# Patient Record
Sex: Female | Born: 1975 | Hispanic: Yes | Marital: Married | Smoking: Never smoker
Health system: Southern US, Community
[De-identification: ages and names within clinical notes are randomized; demographics above are authoritative.]

## PROBLEM LIST (undated history)

## (undated) DIAGNOSIS — D229 Melanocytic nevi, unspecified: Secondary | ICD-10-CM

## (undated) HISTORY — DX: Melanocytic nevi, unspecified: D22.9

---

## 2002-07-07 ENCOUNTER — Inpatient Hospital Stay (HOSPITAL_COMMUNITY): Admission: AD | Admit: 2002-07-07 | Discharge: 2002-07-07 | Payer: Self-pay | Admitting: Family Medicine

## 2002-07-17 ENCOUNTER — Inpatient Hospital Stay (HOSPITAL_COMMUNITY): Admission: AD | Admit: 2002-07-17 | Discharge: 2002-07-17 | Payer: Self-pay | Admitting: *Deleted

## 2002-09-23 ENCOUNTER — Ambulatory Visit (HOSPITAL_COMMUNITY): Admission: RE | Admit: 2002-09-23 | Discharge: 2002-09-23 | Payer: Self-pay | Admitting: *Deleted

## 2003-02-15 ENCOUNTER — Inpatient Hospital Stay (HOSPITAL_COMMUNITY): Admission: AD | Admit: 2003-02-15 | Discharge: 2003-02-19 | Payer: Self-pay | Admitting: *Deleted

## 2003-02-24 ENCOUNTER — Inpatient Hospital Stay (HOSPITAL_COMMUNITY): Admission: AD | Admit: 2003-02-24 | Discharge: 2003-02-24 | Payer: Self-pay | Admitting: Obstetrics & Gynecology

## 2003-03-04 ENCOUNTER — Encounter: Admission: RE | Admit: 2003-03-04 | Discharge: 2003-03-04 | Payer: Self-pay | Admitting: Family Medicine

## 2003-03-18 ENCOUNTER — Encounter: Admission: RE | Admit: 2003-03-18 | Discharge: 2003-03-18 | Payer: Self-pay | Admitting: Family Medicine

## 2003-06-23 ENCOUNTER — Emergency Department (HOSPITAL_COMMUNITY): Admission: AD | Admit: 2003-06-23 | Discharge: 2003-06-23 | Payer: Self-pay | Admitting: Family Medicine

## 2005-08-19 ENCOUNTER — Ambulatory Visit (HOSPITAL_COMMUNITY): Admission: RE | Admit: 2005-08-19 | Discharge: 2005-08-19 | Payer: Self-pay | Admitting: Family Medicine

## 2005-08-20 ENCOUNTER — Ambulatory Visit (HOSPITAL_COMMUNITY): Admission: RE | Admit: 2005-08-20 | Discharge: 2005-08-20 | Payer: Self-pay | Admitting: *Deleted

## 2005-08-20 ENCOUNTER — Ambulatory Visit: Payer: Self-pay | Admitting: *Deleted

## 2005-10-28 ENCOUNTER — Inpatient Hospital Stay (HOSPITAL_COMMUNITY): Admission: AD | Admit: 2005-10-28 | Discharge: 2005-10-28 | Payer: Self-pay | Admitting: Obstetrics

## 2005-10-28 ENCOUNTER — Ambulatory Visit: Payer: Self-pay | Admitting: Obstetrics and Gynecology

## 2005-11-05 ENCOUNTER — Ambulatory Visit (HOSPITAL_COMMUNITY): Admission: RE | Admit: 2005-11-05 | Discharge: 2005-11-05 | Payer: Self-pay | Admitting: *Deleted

## 2006-01-10 ENCOUNTER — Ambulatory Visit: Payer: Self-pay | Admitting: *Deleted

## 2006-01-10 ENCOUNTER — Inpatient Hospital Stay (HOSPITAL_COMMUNITY): Admission: AD | Admit: 2006-01-10 | Discharge: 2006-01-12 | Payer: Self-pay | Admitting: Family Medicine

## 2006-08-05 IMAGING — US US OB FOLLOW-UP
1 series · 18 of 28 positions shown · non-contrast
Comparison: none

CLINICAL DATA: 29 week 5 day assigned gestational age by prior ultrasound.  Follow-up fetal anatomy and growth.

[Series 1: us ob re-eval · 18 of 52 slices shown]
[im 1/52]
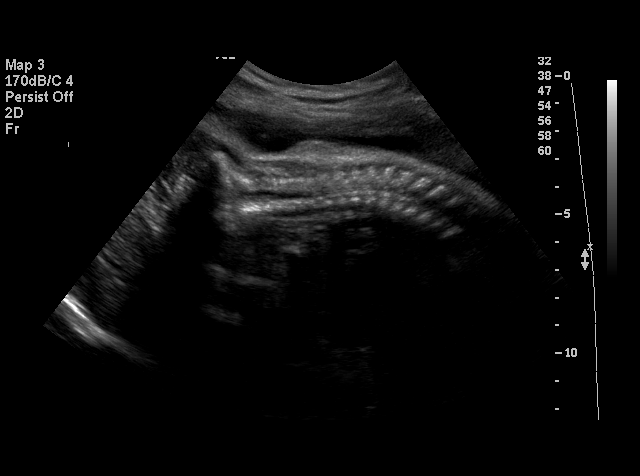
[im 4/52]
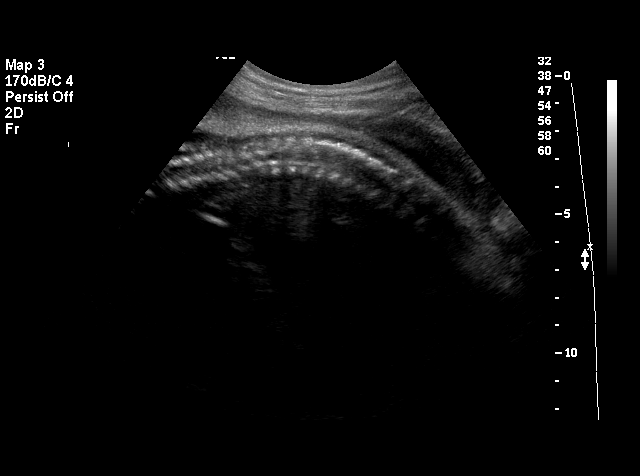
[im 6/52]
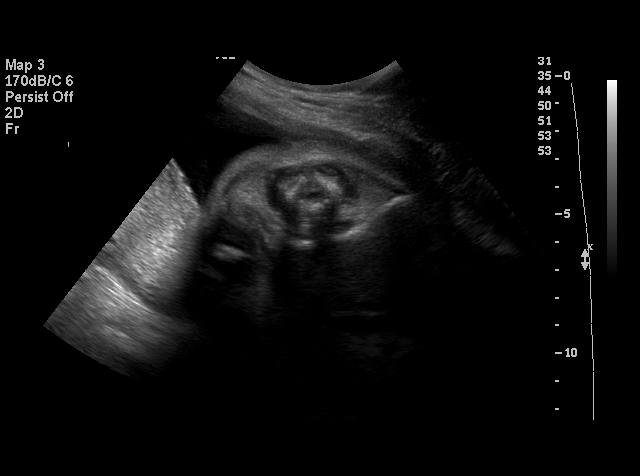
[im 10/52]
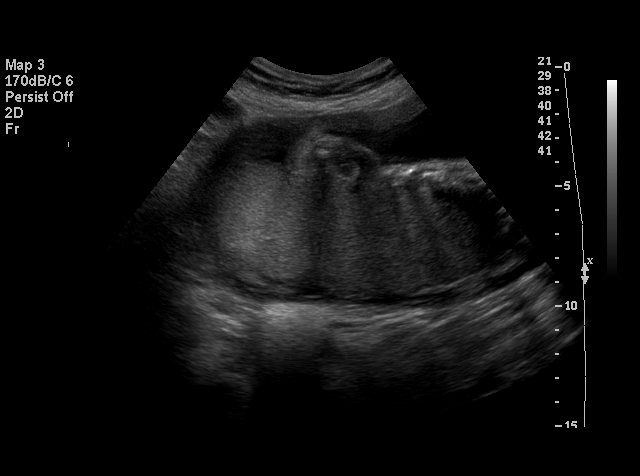
[im 14/52]
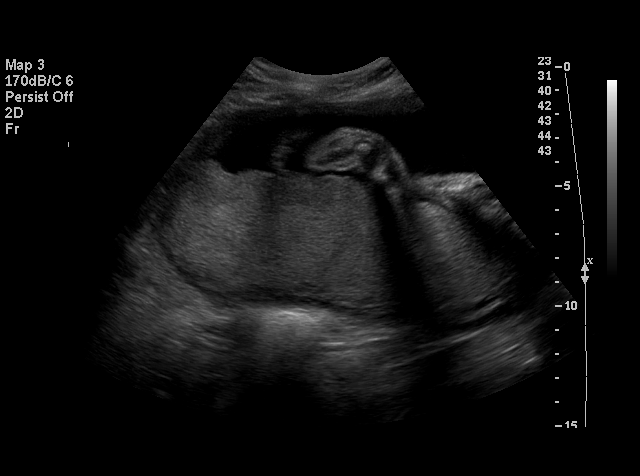
[im 16/52]
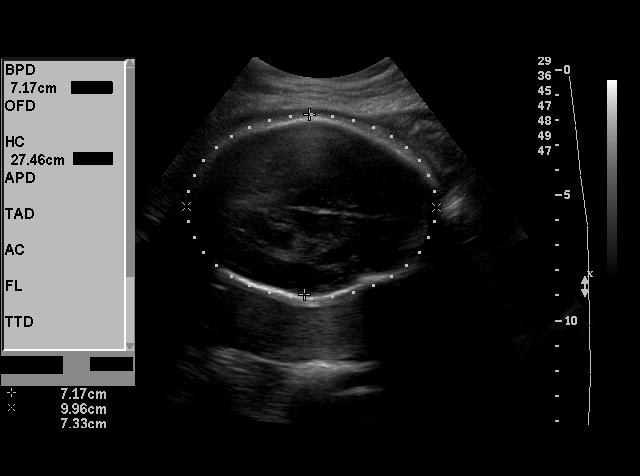
[im 19/52]
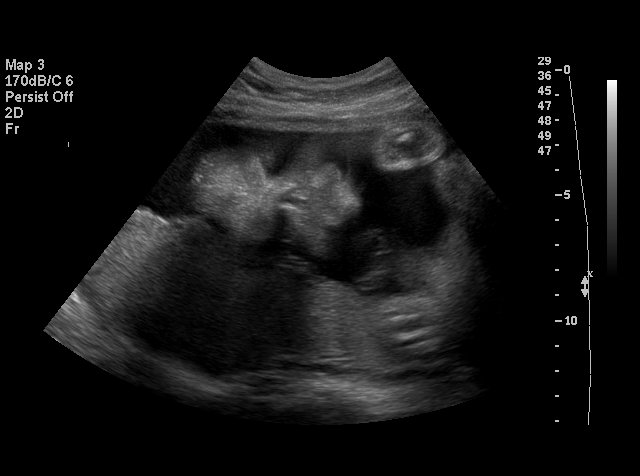
[im 21/52]
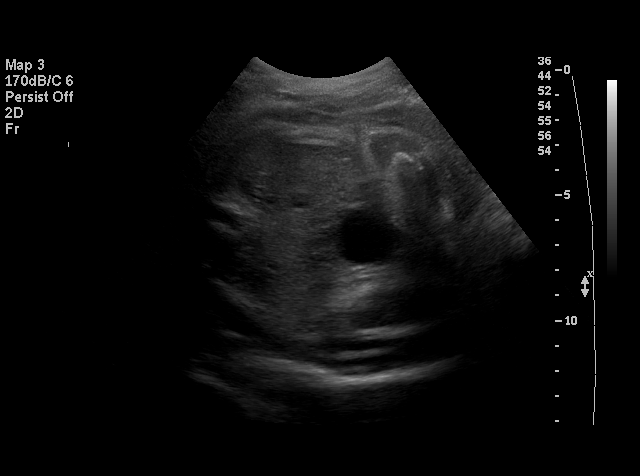
[im 25/52]
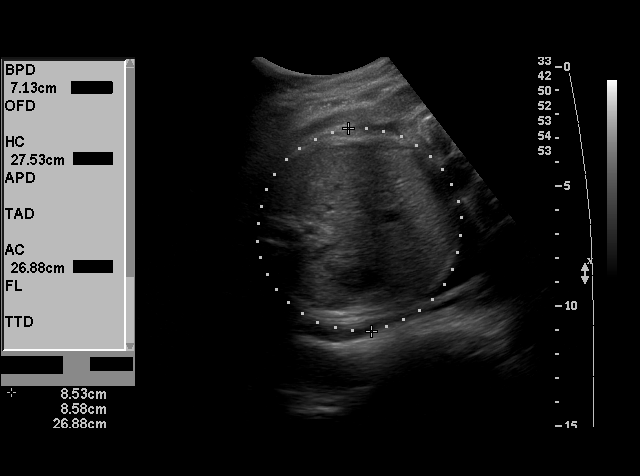
[im 27/52]
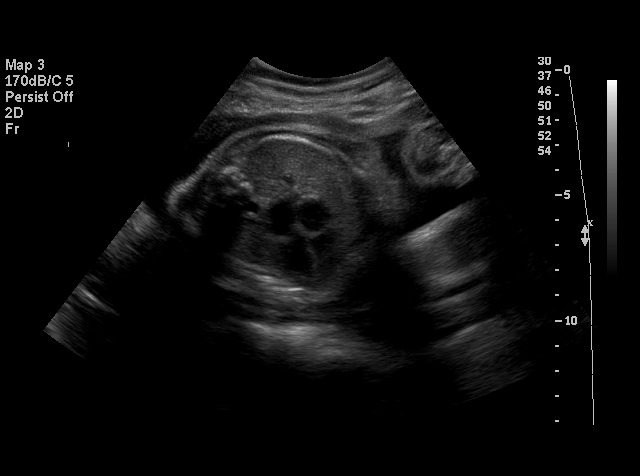
[im 31/52]
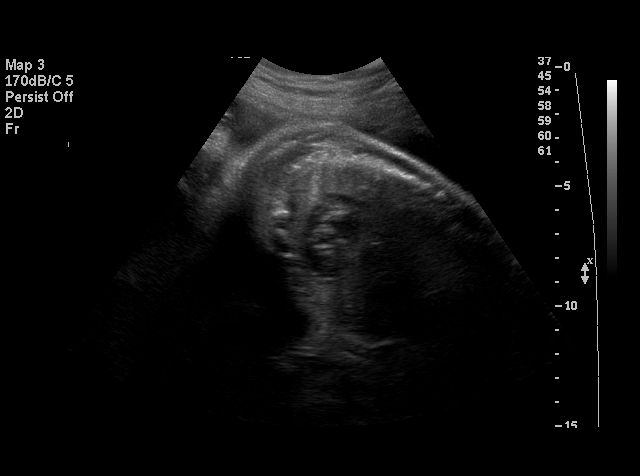
[im 33/52]
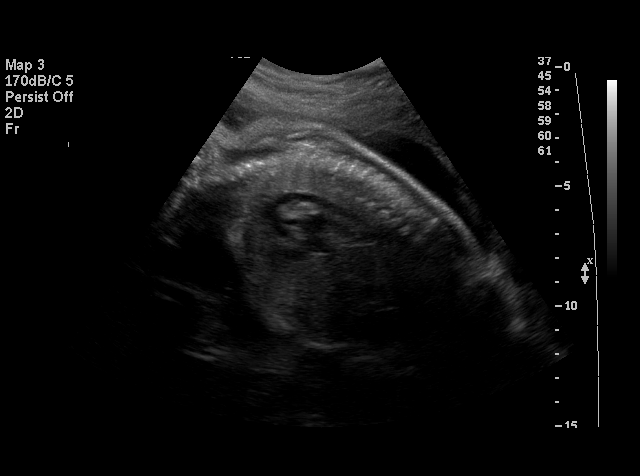
[im 36/52]
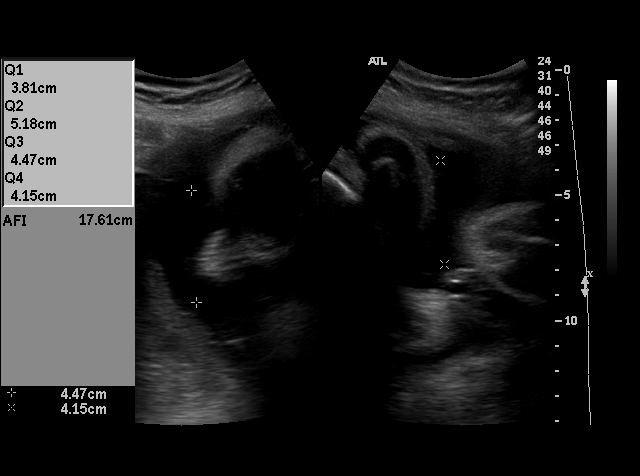
[im 40/52]
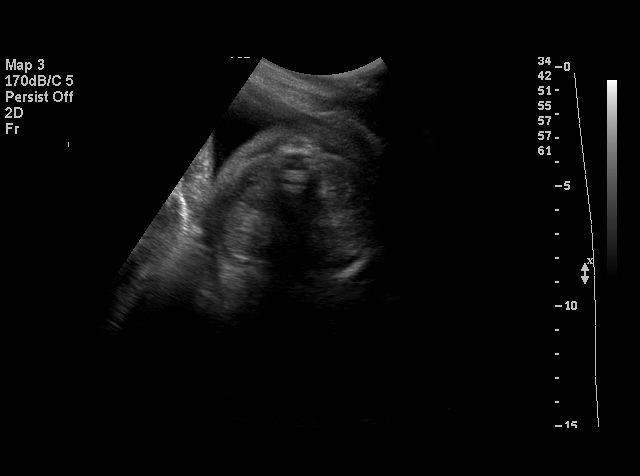
[im 42/52]
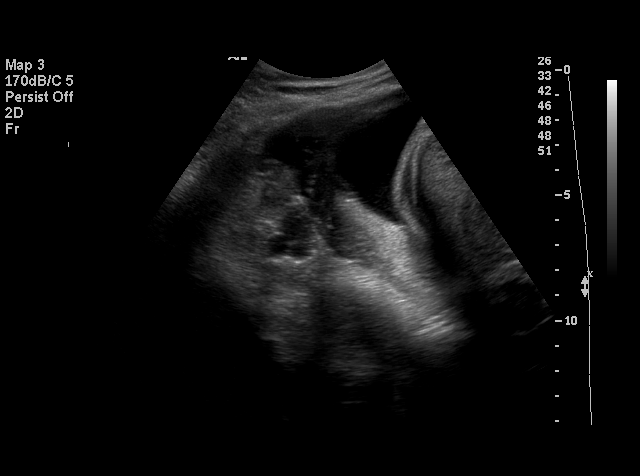
[im 46/52]
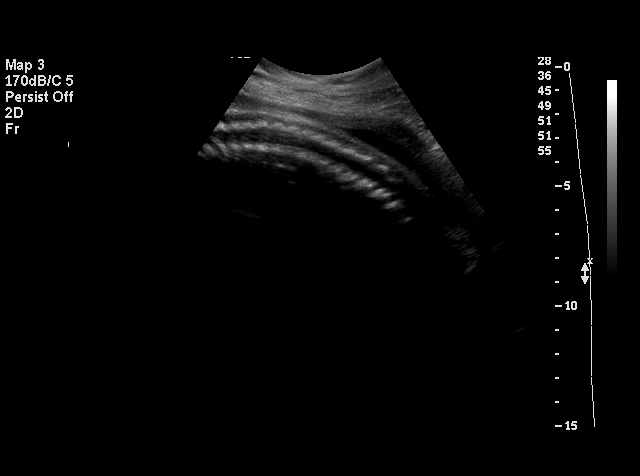
[im 48/52]
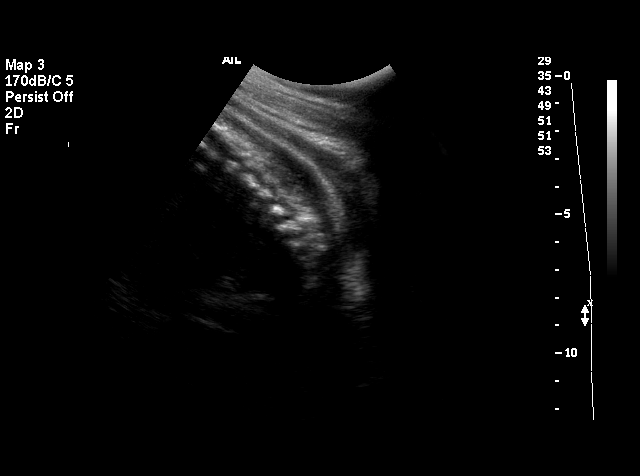
[im 52/52]
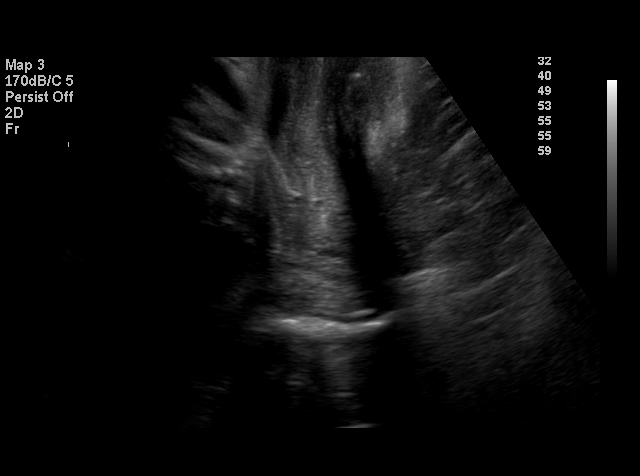

[18 of 28 positions shown; findings below may reference images not displayed]

OBSTETRICAL ULTRASOUND RE-EVALUATION:
 Number of Fetuses: 1
 Heart Rate:  136
 Movement:  Yes
 Breathing:  No
 Presentation:  Breech
 Placental Location:  Posterior
 Grade:  I
 Previa:  No
 Amniotic Fluid (subjective):  Normal
 Amniotic Fluid (objective):  17.6 cm AFI (5th -95th%ile = 7.0 – 23.4 cm for 30 wks)

 FETAL BIOMETRY
 BPD:  7.1 cm   28 w 5 d
 HC:  27.3 cm   29 w 6 d
 AC:  27.3 cm   31 w 3 d
 FL:  5.2 cm   27 w 6 d

 Mean GA:  29 w 4 d  US EDC:  01/17/06
 Assigned GA:  29 w 5 d  Assigned EDC:  01/16/06

 EFW:  6242 g (H) 50th – 75th%ile (9998 – 0300 g) For 30 wks

 FETAL ANATOMY
 Lateral Ventricles:  Visualized 
 Thalami/CSP:  Previously seen 
 Posterior Fossa:  Previously seen 
 Nuchal Region:  N/A
 Spine:  Limited, distal spine not visualized due to position
 4 Chamber Heart on Left:  Visualized 
 Stomach on Left:  Visualized 
 3 Vessel Cord:  Previously seen 
 Cord Insertion Site:  Previously seen 
 Kidneys:  Visualized 
 Bladder:  Visualized 
 Extremities:  Previously seen 
 Evaluation limited by:  Advanced gestational age and fetal position.

 MATERNAL UTERINE AND ADNEXAL FINDINGS
 Cervix:  3.0 cm Translabially
IMPRESSION: 1.  Single living intrauterine fetus with assigned gestational age of 29 weeks 5 days.  Appropriate fetal growth, with EFW at 50th – 75th percentile.  
 2.  Distal spine was not well visualized due to position, however no fetal abnormality identified.

## 2012-03-31 ENCOUNTER — Other Ambulatory Visit (HOSPITAL_COMMUNITY): Payer: Self-pay | Admitting: Physician Assistant

## 2012-03-31 DIAGNOSIS — O3680X0 Pregnancy with inconclusive fetal viability, not applicable or unspecified: Secondary | ICD-10-CM

## 2012-04-02 ENCOUNTER — Other Ambulatory Visit: Payer: Self-pay | Admitting: Obstetrics & Gynecology

## 2012-04-02 ENCOUNTER — Ambulatory Visit (HOSPITAL_COMMUNITY)
Admission: RE | Admit: 2012-04-02 | Discharge: 2012-04-02 | Disposition: A | Discharge status: Home / Self Care | Payer: Self-pay | Source: Ambulatory Visit | Attending: Physician Assistant | Admitting: Physician Assistant

## 2012-04-02 DIAGNOSIS — O3680X0 Pregnancy with inconclusive fetal viability, not applicable or unspecified: Secondary | ICD-10-CM

## 2012-04-02 DIAGNOSIS — O021 Missed abortion: Secondary | ICD-10-CM | POA: Insufficient documentation

## 2012-04-02 DIAGNOSIS — O368390 Maternal care for abnormalities of the fetal heart rate or rhythm, unspecified trimester, not applicable or unspecified: Secondary | ICD-10-CM | POA: Insufficient documentation

## 2012-04-02 NOTE — Progress Notes (Signed)
Patient with missed abortion measuring about [redacted]w[redacted]d, multiple anatomic anomalies noted on ultrasound concerning for chromosomal abnormality.  Eino Farber Muhammed, CNM discussed diagnosis with patient with the help of a Spanish interpreter, appropriate support given to patient. She was counseled about needing a D&E, this was scheduled on 04/03/12 at 1000 with Dr. Catalina Antigua, this will be done under ultrasound-guidance and sample of the products of conception will be sent for chromosomal analysis/cytogenetic analysis.  Routine preoperative instructions were given to the patient including having nothing to eat or drink after midnight on the day prior to surgery and also to arrive to the hospital by 0830 on 04/03/12.

## 2012-04-03 ENCOUNTER — Encounter (HOSPITAL_COMMUNITY): Payer: Self-pay | Admitting: Radiology

## 2012-04-03 ENCOUNTER — Encounter (HOSPITAL_COMMUNITY)
Admission: RE | Disposition: A | Discharge status: Home / Self Care | Payer: Self-pay | Source: Ambulatory Visit | Attending: Obstetrics and Gynecology

## 2012-04-03 ENCOUNTER — Encounter (HOSPITAL_COMMUNITY): Payer: Self-pay | Admitting: Anesthesiology

## 2012-04-03 ENCOUNTER — Ambulatory Visit (HOSPITAL_COMMUNITY): Payer: Self-pay | Admitting: Anesthesiology

## 2012-04-03 ENCOUNTER — Encounter (HOSPITAL_COMMUNITY): Payer: Self-pay | Admitting: *Deleted

## 2012-04-03 ENCOUNTER — Other Ambulatory Visit: Payer: Self-pay | Admitting: Obstetrics & Gynecology

## 2012-04-03 ENCOUNTER — Other Ambulatory Visit (HOSPITAL_COMMUNITY): Payer: Self-pay | Admitting: Obstetrics & Gynecology

## 2012-04-03 ENCOUNTER — Ambulatory Visit (HOSPITAL_COMMUNITY)
Admission: RE | Admit: 2012-04-03 | Discharge: 2012-04-03 | Disposition: A | Discharge status: Home / Self Care | Payer: MEDICAID | Source: Ambulatory Visit | Attending: Obstetrics and Gynecology | Admitting: Obstetrics and Gynecology

## 2012-04-03 ENCOUNTER — Inpatient Hospital Stay (HOSPITAL_COMMUNITY): Payer: Self-pay

## 2012-04-03 DIAGNOSIS — IMO0002 Reserved for concepts with insufficient information to code with codable children: Secondary | ICD-10-CM

## 2012-04-03 DIAGNOSIS — O021 Missed abortion: Secondary | ICD-10-CM | POA: Insufficient documentation

## 2012-04-03 HISTORY — PX: DILATION AND EVACUATION: SHX1459

## 2012-04-03 LAB — CBC
MCHC: 33.7 g/dL (ref 30.0–36.0)
Platelets: 268 10*3/uL (ref 150–400)
RDW: 13.3 % (ref 11.5–15.5)
WBC: 6.9 10*3/uL (ref 4.0–10.5)

## 2012-04-03 SURGERY — DILATION AND EVACUATION, UTERUS, SECOND TRIMESTER
Anesthesia: General | Site: Uterus | Wound class: Clean Contaminated

## 2012-04-03 MED ORDER — MIDAZOLAM HCL 5 MG/5ML IJ SOLN
INTRAMUSCULAR | Status: DC | PRN
  Administered 2012-04-03: 2 mg via INTRAVENOUS

## 2012-04-03 MED ORDER — PROPOFOL 10 MG/ML IV EMUL
INTRAVENOUS | Status: AC
  Filled 2012-04-03: qty 20

## 2012-04-03 MED ORDER — NEOSTIGMINE METHYLSULFATE 1 MG/ML IJ SOLN
INTRAMUSCULAR | Status: DC | PRN
  Administered 2012-04-03: 2 mg via INTRAVENOUS

## 2012-04-03 MED ORDER — MIDAZOLAM HCL 2 MG/2ML IJ SOLN
INTRAMUSCULAR | Status: AC
  Filled 2012-04-03: qty 2

## 2012-04-03 MED ORDER — LACTATED RINGERS IV SOLN: INTRAVENOUS | Status: DC

## 2012-04-03 MED ORDER — FENTANYL CITRATE 0.05 MG/ML IJ SOLN
INTRAMUSCULAR | Status: DC | PRN
  Administered 2012-04-03: 150 ug via INTRAVENOUS
  Administered 2012-04-03: 100 ug via INTRAVENOUS

## 2012-04-03 MED ORDER — ROCURONIUM BROMIDE 50 MG/5ML IV SOLN
INTRAVENOUS | Status: AC
  Filled 2012-04-03: qty 1

## 2012-04-03 MED ORDER — METHYLERGONOVINE MALEATE 0.2 MG PO TABS: 0.2000 mg | ORAL_TABLET | Freq: Four times a day (QID) | ORAL | Status: DC

## 2012-04-03 MED ORDER — ONDANSETRON HCL 4 MG/2ML IJ SOLN
INTRAMUSCULAR | Status: AC
  Filled 2012-04-03: qty 2

## 2012-04-03 MED ORDER — ROCURONIUM BROMIDE 100 MG/10ML IV SOLN
INTRAVENOUS | Status: DC | PRN
  Administered 2012-04-03: 25 mg via INTRAVENOUS

## 2012-04-03 MED ORDER — OXYCODONE-ACETAMINOPHEN 5-325 MG PO TABS: 1.0000 | ORAL_TABLET | ORAL | Status: AC | PRN

## 2012-04-03 MED ORDER — LIDOCAINE HCL 1 % IJ SOLN
INTRAMUSCULAR | Status: DC | PRN
  Administered 2012-04-03: 10 mL

## 2012-04-03 MED ORDER — LACTATED RINGERS IV SOLN
INTRAVENOUS | Status: DC
  Administered 2012-04-03 (×2): via INTRAVENOUS

## 2012-04-03 MED ORDER — DEXAMETHASONE SODIUM PHOSPHATE 10 MG/ML IJ SOLN
INTRAMUSCULAR | Status: AC
  Filled 2012-04-03: qty 1

## 2012-04-03 MED ORDER — ONDANSETRON HCL 4 MG/2ML IJ SOLN
INTRAMUSCULAR | Status: DC | PRN
  Administered 2012-04-03: 4 mg via INTRAVENOUS

## 2012-04-03 MED ORDER — KETOROLAC TROMETHAMINE 30 MG/ML IJ SOLN
INTRAMUSCULAR | Status: DC | PRN
  Administered 2012-04-03: 30 mg via INTRAVENOUS

## 2012-04-03 MED ORDER — FENTANYL CITRATE 0.05 MG/ML IJ SOLN
INTRAMUSCULAR | Status: AC
  Filled 2012-04-03: qty 5

## 2012-04-03 MED ORDER — NEOSTIGMINE METHYLSULFATE 1 MG/ML IJ SOLN
INTRAMUSCULAR | Status: AC
  Filled 2012-04-03: qty 10

## 2012-04-03 MED ORDER — LIDOCAINE HCL (CARDIAC) 20 MG/ML IV SOLN
INTRAVENOUS | Status: AC
  Filled 2012-04-03: qty 5

## 2012-04-03 MED ORDER — PROPOFOL 10 MG/ML IV EMUL
INTRAVENOUS | Status: DC | PRN
  Administered 2012-04-03: 130 mg via INTRAVENOUS
  Administered 2012-04-03: 20 mg via INTRAVENOUS

## 2012-04-03 MED ORDER — DOXYCYCLINE HYCLATE 100 MG PO TABS: 100.0000 mg | ORAL_TABLET | Freq: Two times a day (BID) | ORAL | Status: AC

## 2012-04-03 MED ORDER — DEXAMETHASONE SODIUM PHOSPHATE 10 MG/ML IJ SOLN
INTRAMUSCULAR | Status: DC | PRN
  Administered 2012-04-03: 10 mg via INTRAVENOUS

## 2012-04-03 MED ORDER — GLYCOPYRROLATE 0.2 MG/ML IJ SOLN
INTRAMUSCULAR | Status: AC
  Filled 2012-04-03: qty 2

## 2012-04-03 MED ORDER — GLYCOPYRROLATE 0.2 MG/ML IJ SOLN
INTRAMUSCULAR | Status: DC | PRN
  Administered 2012-04-03: 0.4 mg via INTRAVENOUS

## 2012-04-03 MED ORDER — KETOROLAC TROMETHAMINE 30 MG/ML IJ SOLN: 15.0000 mg | Freq: Once | INTRAMUSCULAR | Status: DC | PRN

## 2012-04-03 MED ORDER — LIDOCAINE HCL (CARDIAC) 20 MG/ML IV SOLN
INTRAVENOUS | Status: DC | PRN
  Administered 2012-04-03: 40 mg via INTRAVENOUS

## 2012-04-03 MED ORDER — MEPERIDINE HCL 25 MG/ML IJ SOLN: 6.2500 mg | INTRAMUSCULAR | Status: DC | PRN

## 2012-04-03 MED ORDER — FENTANYL CITRATE 0.05 MG/ML IJ SOLN: 25.0000 ug | INTRAMUSCULAR | Status: DC | PRN

## 2012-04-03 MED ORDER — DOXYCYCLINE HYCLATE 100 MG IV SOLR
200.0000 mg | INTRAVENOUS | Status: AC
  Administered 2012-04-03: 200 mg via INTRAVENOUS
  Filled 2012-04-03: qty 200

## 2012-04-03 MED ORDER — ONDANSETRON HCL 4 MG/2ML IJ SOLN: 4.0000 mg | Freq: Once | INTRAMUSCULAR | Status: DC | PRN

## 2012-04-03 SURGICAL SUPPLY — 16 items
CLOTH BEACON ORANGE TIMEOUT ST (SAFETY) ×2 IMPLANT
CONT PATH 16OZ SNAP LID 3702 (MISCELLANEOUS) ×1 IMPLANT
DRAPE HYSTEROSCOPY (DRAPE) ×2 IMPLANT
GLOVE BIO SURGEON STRL SZ 6.5 (GLOVE) ×4 IMPLANT
GOWN PREVENTION PLUS LG XLONG (DISPOSABLE) ×4 IMPLANT
KIT BERKELEY 2ND TRIMESTER 1/2 (COLLECTOR) ×2 IMPLANT
NDL SPNL 22GX3.5 QUINCKE BK (NEEDLE) ×1 IMPLANT
NEEDLE SPNL 22GX3.5 QUINCKE BK (NEEDLE) ×2 IMPLANT
NS IRRIG 1000ML POUR BTL (IV SOLUTION) ×2 IMPLANT
PACK VAGINAL MINOR WOMEN LF (CUSTOM PROCEDURE TRAY) ×2 IMPLANT
PAD PREP 24X48 CUFFED NSTRL (MISCELLANEOUS) ×2 IMPLANT
SYR CONTROL 10ML LL (SYRINGE) ×2 IMPLANT
TOWEL OR 17X24 6PK STRL BLUE (TOWEL DISPOSABLE) ×4 IMPLANT
TUBE VACURETTE 2ND TRIMESTER (CANNULA) ×2 IMPLANT
VACURETTE 14MM CVD 1/2 BASE (CANNULA) ×1 IMPLANT
VACURETTE 16MM ASPIR CVD .5 (CANNULA) ×1 IMPLANT

## 2012-04-03 NOTE — Op Note (Signed)
Erin Dickerson PROCEDURE DATE: 04/03/2012  PREOPERATIVE DIAGNOSIS: 14 week missed abortion. POSTOPERATIVE DIAGNOSIS: The same. PROCEDURE:     Dilation and Evacuation. SURGEON:  Dr. Catalina Antigua ASSISTANT:  Dr. Candelaria Celeste  INDICATIONS: 36 y.o. G1P0with MAB at [redacted] weeks gestation, needing surgical completion.  Risks of surgery were discussed with the patient including but not limited to: bleeding which may require transfusion; infection which may require antibiotics; injury to uterus or surrounding organs;need for additional procedures including laparotomy or laparoscopy; possibility of intrauterine scarring which may impair future fertility; and other postoperative/anesthesia complications. Written informed consent was obtained.    FINDINGS:  A 14 size midline uterus, moderate amounts of products of conception, specimen sent to pathology.  ANESTHESIA:    Monitored intravenous sedation, paracervical block. INTRAVENOUS FLUIDS:  1000 ml of LR ESTIMATED BLOOD LOSS:  300 ml. SPECIMENS:  Products of conception sent to pathology COMPLICATIONS:  None immediate.  PROCEDURE DETAILS:  The patient received intravenous antibiotics while in the preoperative area.  She was then taken to the operating room where general anesthesia was administered and was found to be adequate.  After an adequate timeout was performed, she was placed in the dorsal lithotomy position and examined; then prepped and draped in the sterile manner.   Her bladder was catheterized for an unmeasured amount of clear, yellow urine. A vaginal speculum was then placed in the patient's vagina and a paracervical block using 1% Lidocaine was administered.  A single tooth tenaculum was then applied to the anterior lip of the cervix. The cervix was gently dilated to a 47 [place] dilator size.  With assistance of ultrasonography, a placenta forcep was used to grasp and extract fetal tissue.  Due to multiple fetal anomalies and  discrepancy of dating, the only fetal parts that were identified were a hand and the face.  A 14 mm suction curette was then gently advanced to the uterine fundus and the suction device was then activated and curette slowly rotated to clear the uterus of products of conception.  A sharp curettage was then performed to confirm completer emptying of the uterus.  Ultrasonography confirmed an empty uterus with a thin endometrial stripe.  There was minimal bleeding noted and the tenaculum removed with good hemostasis noted.  The patient tolerated the procedure well.  The patient was taken to the recovery area in stable condition.  Levie Heritage, DO 04/03/2012 11:42 AM

## 2012-04-03 NOTE — Anesthesia Preprocedure Evaluation (Signed)
Anesthesia Evaluation  Patient identified by MRN, date of birth, ID band Patient awake    Reviewed: Allergy & Precautions, H&P , NPO status , Patient's Chart, lab work & pertinent test results  Airway Mallampati: II TM Distance: >3 FB Neck ROM: full    Dental No notable dental hx. (+) Teeth Intact   Pulmonary neg pulmonary ROS,    Pulmonary exam normal       Cardiovascular negative cardio ROS      Neuro/Psych negative neurological ROS  negative psych ROS   GI/Hepatic negative GI ROS, Neg liver ROS,   Endo/Other  negative endocrine ROS  Renal/GU negative Renal ROS  negative genitourinary   Musculoskeletal negative musculoskeletal ROS (+)   Abdominal Normal abdominal exam  (+)   Peds negative pediatric ROS (+)  Hematology negative hematology ROS (+)   Anesthesia Other Findings   Reproductive/Obstetrics (+) Pregnancy                           Anesthesia Physical Anesthesia Plan  ASA: II  Anesthesia Plan: General   Post-op Pain Management:    Induction: Intravenous  Airway Management Planned: Oral ETT  Additional Equipment:   Intra-op Plan:   Post-operative Plan: Extubation in OR  Informed Consent: I have reviewed the patients History and Physical, chart, labs and discussed the procedure including the risks, benefits and alternatives for the proposed anesthesia with the patient or authorized representative who has indicated his/her understanding and acceptance.   Dental Advisory Given  Plan Discussed with: CRNA and Surgeon  Anesthesia Plan Comments:         Anesthesia Quick Evaluation

## 2012-04-03 NOTE — H&P (Signed)
  Erin Dickerson is a 36 y.o. female.  She presents with missed abortion measuring about [redacted]w[redacted]d, multiple anatomic anomalies noted on ultrasound concerning for chromosomal abnormality. She has mild cramping.  Denies bleeding, fevers, chills, nausea.  Patient Active Problem List  Diagnosis  . Missed abortion at [redacted] weeks GA   Past Medical History: History reviewed. No pertinent past medical history.  Past Surgical History:  Past Surgical History  Procedure Date  . Cesarean section 2004    Obstetrical History:  OB History    Grav Para Term Preterm Abortions TAB SAB Ect Mult Living                  Social History:  History   Social History  . Marital Status: Married    Spouse Name: N/A    Number of Children: N/A  . Years of Education: N/A   Social History Main Topics  . Smoking status: Never Smoker   . Smokeless tobacco: None  . Alcohol Use: No  . Drug Use: No  . Sexually Active: Yes   Other Topics Concern  . None   Social History Narrative  . None    Family History: History reviewed. No pertinent family history.  Medications:  Prenatal vitamins,  Current Facility-Administered Medications  Medication Dose Route Frequency Provider Last Rate Last Dose  . doxycycline (VIBRAMYCIN) 200 mg in dextrose 5 % 250 mL IVPB  200 mg Intravenous On Call to OR Ugonna A Anyanwu, MD      . lactated ringers infusion   Intravenous Continuous Tereso Newcomer, MD 125 mL/hr at 04/03/12 0933    . lactated ringers infusion   Intravenous Continuous Velna Hatchet, MD        Allergies: No Known Allergies  Review of Systems: - Negative  Physical Exam: Blood pressure 117/69, pulse 73, temperature 98.4 F (36.9 C), temperature source Oral, resp. rate 16, height 5\' 1"  (1.549 m), weight 68.493 kg (151 lb), SpO2 100.00%. GENERAL: Well-developed, well-nourished female in no acute distress.  LUNGS: Clear to auscultation bilaterally.  HEART: Regular rate and rhythm. ABDOMEN:  Soft, nontender, nondistended. EXTREMITIES: Nontender, no edema, 2+ distal pulses.    Pertinent Labs/Studies:  None  Assessment : Erin Dickerson is a 36 y.o. here for D&E for missed abortion.    Plan: Risks including bleeding, perforation, damage to internal organs, damage to uterus discussed with patient.  Will proceed with D&E.  NPO since last night.  Doxycycline being administered preoperatively.  Erin Dickerson 04/03/2012, 10:13 AM

## 2012-04-03 NOTE — Anesthesia Postprocedure Evaluation (Signed)
Anesthesia Post Note  Patient: Erin Dickerson  Procedure(s) Performed: Procedure(s) (LRB): DILATATION AND EVACUATION (D&E) 2ND TRIMESTER (N/A)  Anesthesia type: General  Patient location: PACU  Post pain: Pain level controlled  Post assessment: Post-op Vital signs reviewed  Last Vitals:  Filed Vitals:   04/03/12 1200  BP: 103/68  Pulse: 68  Temp:   Resp: 16    Post vital signs: Reviewed  Level of consciousness: sedated  Complications: No apparent anesthesia complicationsfj

## 2012-04-03 NOTE — Transfer of Care (Signed)
Immediate Anesthesia Transfer of Care Note  Patient: Erin Dickerson  Procedure(s) Performed: Procedure(s) (LRB): DILATATION AND EVACUATION (D&E) 2ND TRIMESTER (N/A)  Patient Location: PACU  Anesthesia Type: General  Level of Consciousness: awake  Airway & Oxygen Therapy: Patient Spontanous Breathing and Patient connected to nasal cannula oxygen  Post-op Assessment: Report given to PACU RN and Post -op Vital signs reviewed and stable  Post vital signs: stable  Complications: No apparent anesthesia complications

## 2012-04-06 ENCOUNTER — Telehealth: Payer: Self-pay | Admitting: *Deleted

## 2012-04-06 ENCOUNTER — Encounter: Payer: Self-pay | Admitting: *Deleted

## 2012-04-06 ENCOUNTER — Encounter (HOSPITAL_COMMUNITY): Payer: Self-pay | Admitting: Obstetrics and Gynecology

## 2012-04-06 DIAGNOSIS — O021 Missed abortion: Secondary | ICD-10-CM

## 2012-04-06 NOTE — Telephone Encounter (Signed)
Erin Dickerson left a message stating that she was calling on patients behalf, she needs to talk to someone about a doctors note.

## 2012-04-06 NOTE — Telephone Encounter (Signed)
Called patient home number with Interpreter Alejandra and left a message we are retuning your call- please call clinic during office hours.

## 2012-04-07 ENCOUNTER — Encounter: Payer: Self-pay | Admitting: *Deleted

## 2012-04-07 NOTE — Telephone Encounter (Signed)
Called pt w/interpreter Erin Dickerson. Pt stated that she was needing a letter for her employer stating that she had surgery and when she could return to work.  She had come to clinic yesterday and letter was completed by Zola Button. Pt had no further questions.

## 2012-04-08 NOTE — Op Note (Signed)
Agree with above note.  Erin Dickerson 04/08/2012 3:56 PM

## 2012-05-20 ENCOUNTER — Encounter: Payer: Self-pay | Admitting: Obstetrics & Gynecology

## 2012-05-20 ENCOUNTER — Ambulatory Visit (INDEPENDENT_AMBULATORY_CARE_PROVIDER_SITE_OTHER): Payer: Self-pay | Admitting: Obstetrics & Gynecology

## 2012-05-20 VITALS — BP 109/64 | HR 65 | Temp 97.1°F | Ht <= 58 in | Wt 148.8 lb

## 2012-05-20 DIAGNOSIS — Z9889 Other specified postprocedural states: Secondary | ICD-10-CM

## 2012-05-20 DIAGNOSIS — Z09 Encounter for follow-up examination after completed treatment for conditions other than malignant neoplasm: Secondary | ICD-10-CM

## 2012-05-20 NOTE — Progress Notes (Signed)
  Subjective:    Patient ID: Erin Dickerson, female    DOB: 07-26-1976, 36 y.o.   MRN: [id number]  HPI  She is now about 6 weeks post op status post d&c for missed ab at about [redacted] weeks EGA. She denies any problems. She would like another pregnancy. She had sex at about 4 weeks post op and used condoms. She agrees to wait about 2 months before having unprotected intercourse. She had a normal period 05-07-12. She is taking PNVs daily.  Review of Systems    [facility] month for a pap smear. Objective:   Physical Exam        Assessment & Plan:   Post op doing well RTC prn

## 2012-08-17 ENCOUNTER — Encounter: Payer: Self-pay | Admitting: Obstetrics and Gynecology

## 2012-09-02 NOTE — L&D Delivery Note (Signed)
Delivery Note Pt pushed well and at t 5:54 AM a healthy female was delivered via  (Presentation:8 ; 9 ).  APGAR: 9, 9; weight pending.   Placenta status: Intact, Spontaneous.  Cord:  with the following complications: tight nuchal delivered through. Fundus contracted well with massage after delivery.  Anesthesia:  none Episiotomy: none Lacerations: none Suture Repair: n/a Est. Blood Loss (mL): 400cc  Mom to postpartum.  Baby to stay with mom. Explained circumcision to patient with translator and she declines.  She also has decided she does not want permanent sterilzation at this time. Oliver Pila 02/24/2013, 6:29 AM

## 2012-09-17 ENCOUNTER — Encounter (HOSPITAL_COMMUNITY): Payer: Self-pay | Admitting: Emergency Medicine

## 2012-09-17 ENCOUNTER — Emergency Department (HOSPITAL_COMMUNITY)
Admission: EM | Admit: 2012-09-17 | Discharge: 2012-09-17 | Disposition: A | Discharge status: Home / Self Care | Payer: Commercial Managed Care - PPO | Source: Home / Self Care | Attending: Family Medicine | Admitting: Family Medicine

## 2012-09-17 DIAGNOSIS — Z3201 Encounter for pregnancy test, result positive: Secondary | ICD-10-CM

## 2012-09-17 MED ORDER — PRENATAL MULTIVITAMIN CH: 1.0000 | ORAL_TABLET | Freq: Every day | ORAL | Status: AC

## 2012-09-17 NOTE — ED Notes (Addendum)
Pt is here for a preg test.  Did a home preg test and it was positive Sx today include: headache and dizziness Deneis: fevers, vomiting, nauseas, diarrhea LMP was 07/05/12  She is alert w/no signs of acute distress.

## 2012-09-22 NOTE — ED Provider Notes (Signed)
History     CSN: [id number]  Arrival date & time 09/17/12  1818   First MD Initiated Contact with Patient 09/17/12 1830      Chief Complaint  Patient presents with  . Possible Pregnancy    (Consider location/radiation/quality/duration/timing/severity/associated sxs/prior treatment) HPI Comments: 37 y/o female G5 P2T2P0A2L2 had a missed abortion at 14 weeks on Aug.2013 apparently with multiple malformations of the fetus. States she has used condoms inconsistently after her I&D. And her last menstrual period was the 1st week of novemnber. She is having morning nausea but no vomiting denies pelvic pain or pressure or vaginal bleeding or vaginal discharge also denies dysuria.  Taking prenatal vitamins.    History reviewed. No pertinent past medical history.  Past Surgical History  Procedure Date  . Cesarean section 2004  . Dilation and evacuation 04/03/2012    Procedure: DILATATION AND EVACUATION (D&E) 2ND TRIMESTER;  Surgeon: Catalina Antigua, MD;  Location: [facility];  Service: Gynecology;  Laterality: N/A;  with ultrasound/chromosome studies    No family history on file.  History  Substance Use Topics  . Smoking status: Never Smoker   . Smokeless tobacco: Not on file  . Alcohol Use: No    OB History    Grav Para Term Preterm Abortions TAB SAB Ect Mult Living   1               Review of Systems  Constitutional: Negative for fatigue.  Gastrointestinal: Positive for nausea. Negative for abdominal pain.  Genitourinary: Negative for dysuria, vaginal bleeding, vaginal discharge and pelvic pain.  Neurological: Negative for dizziness and headaches.  All other systems reviewed and are negative.    Allergies  Review of patient's allergies indicates no known allergies.  Home Medications   Current Outpatient Rx  Name  Route  Sig  Dispense  Refill  . PRENATAL MULTIVITAMIN CH   Oral   Take 1 tablet by mouth daily.   30 tablet   3     BP 113/78  Pulse 69  Temp 98 F (36.7  C) (Oral)  Resp 16  SpO2 100%  LMP 07/05/2012  Breastfeeding? No  Physical Exam  Nursing note and vitals reviewed. Constitutional: She is oriented to person, place, and time. She appears well-developed and well-nourished. No distress.  HENT:  Head: Normocephalic and atraumatic.  Mouth/Throat: No oropharyngeal exudate.  Eyes: Conjunctivae normal are normal. No scleral icterus.  Neck: Neck supple. No thyromegaly present.  Cardiovascular: Normal heart sounds.   Pulmonary/Chest: Breath sounds normal.  Abdominal: Soft. There is no tenderness.       Uterine fundus not palpable.  Neurological: She is alert and oriented to person, place, and time.  Skin: No rash noted. She is not diaphoretic.    ED Course  Procedures (including critical care time)  Labs Reviewed  POCT PREGNANCY, URINE - Abnormal; Notable for the following:    Preg Test, Ur POSITIVE (*)     All other components within normal limits  LAB REPORT - SCANNED   No results found.   1. Pregnancy examination or test, positive result       MDM  Continue prenatal vitamins. Supportive care discussed with patient and provided in writing. OB referal to establish prenatal care provided.         Sharin Grave, MD 09/22/12 1806

## 2012-11-23 LAB — OB RESULTS CONSOLE ABO/RH: RH Type: POSITIVE

## 2012-12-31 IMAGING — US US OB COMP +14 WK
1 series · 13 of 28 positions shown · non-contrast
Comparison: none

[Series 1: us ob comp +14 wk · 28 acquisitions, 13 frames shown]
[im 2/28]
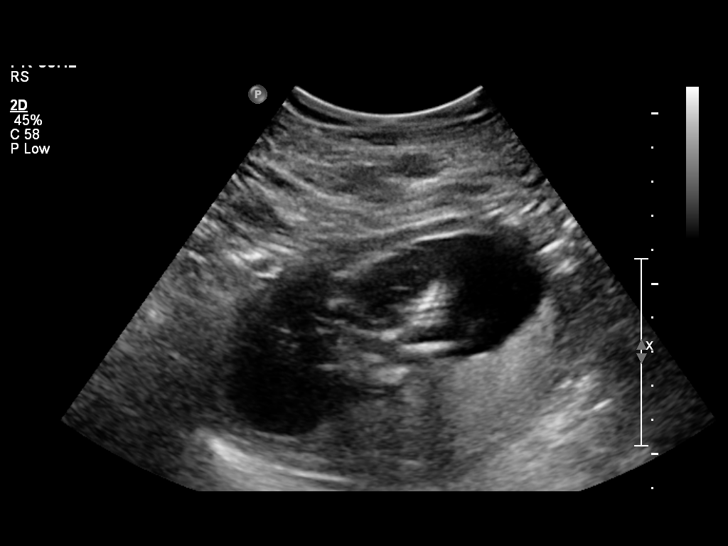
[im 4/28]
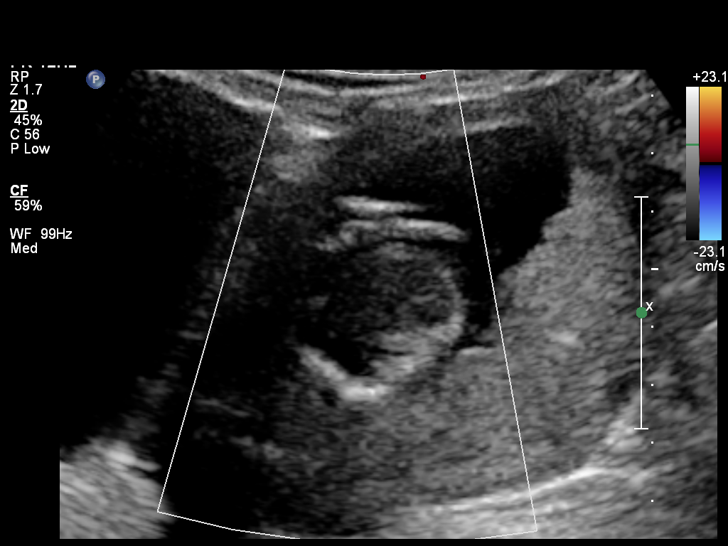
[im 6/28]
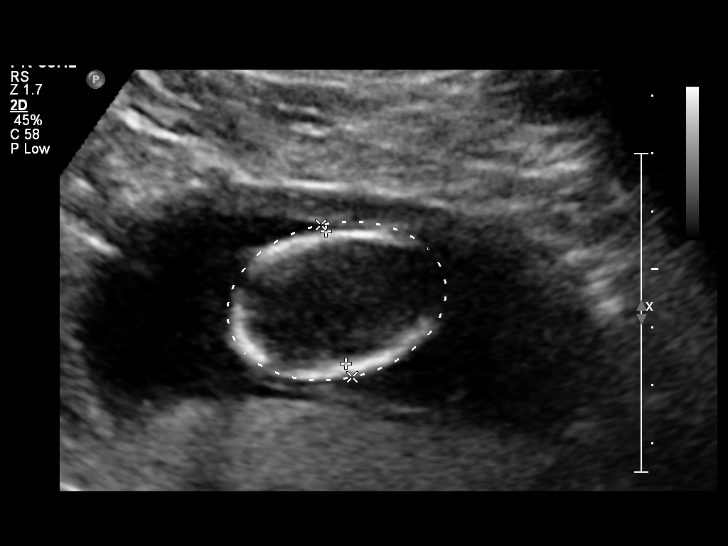
[im 8/28]
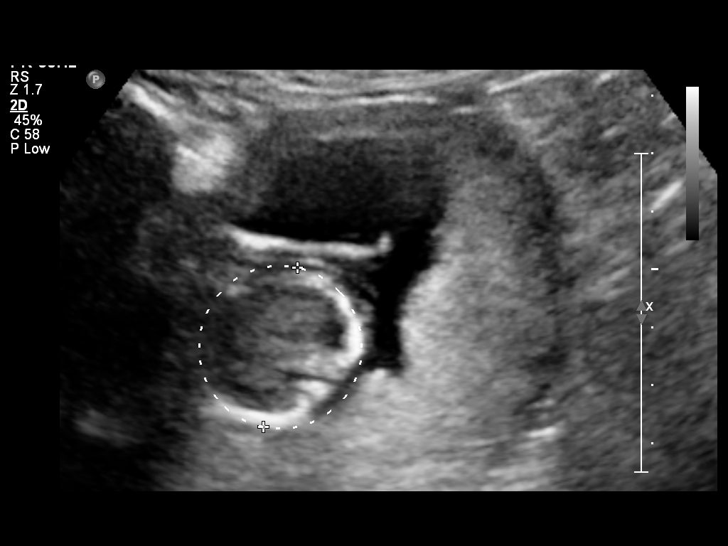
[im 10/28]
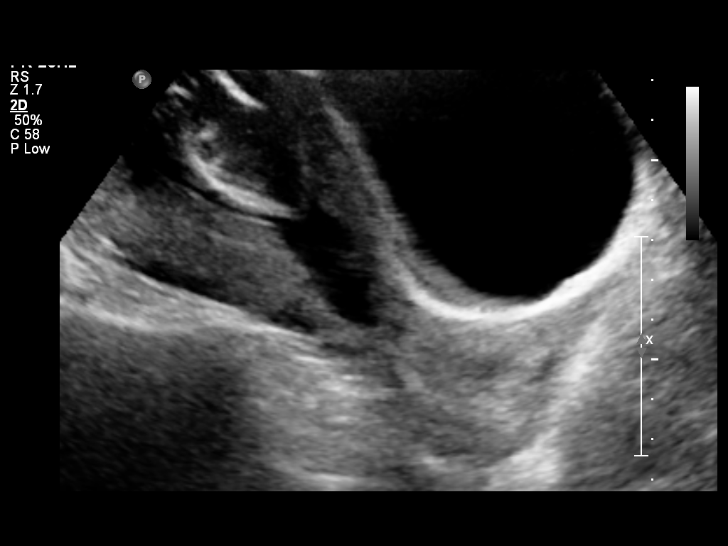
[im 12/28]
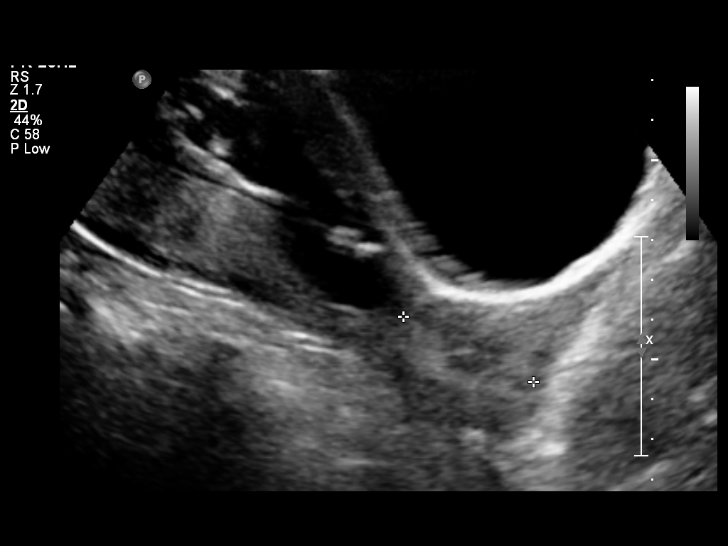
[im 15/28]
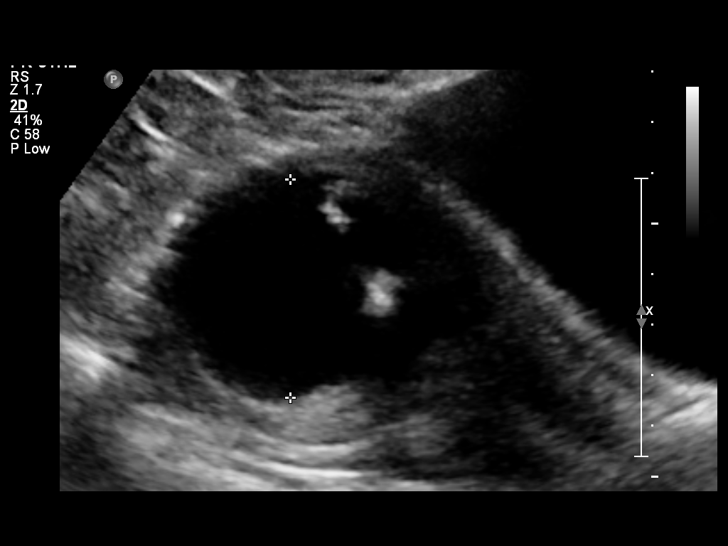
[im 17/28]
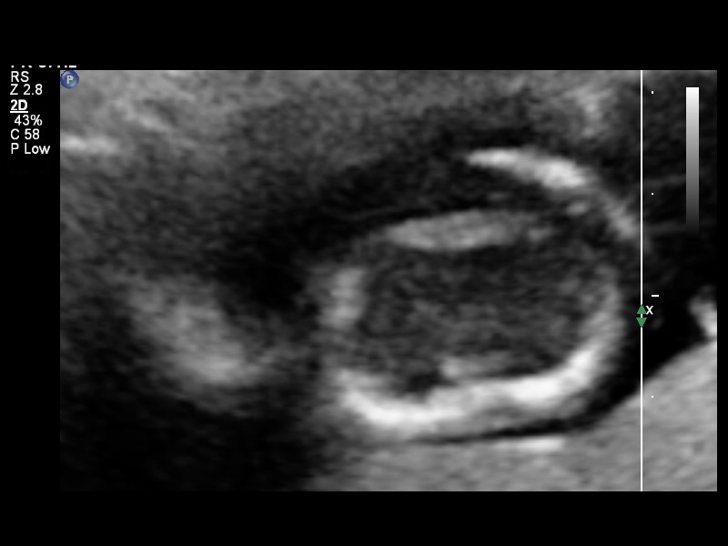
[im 19/28]
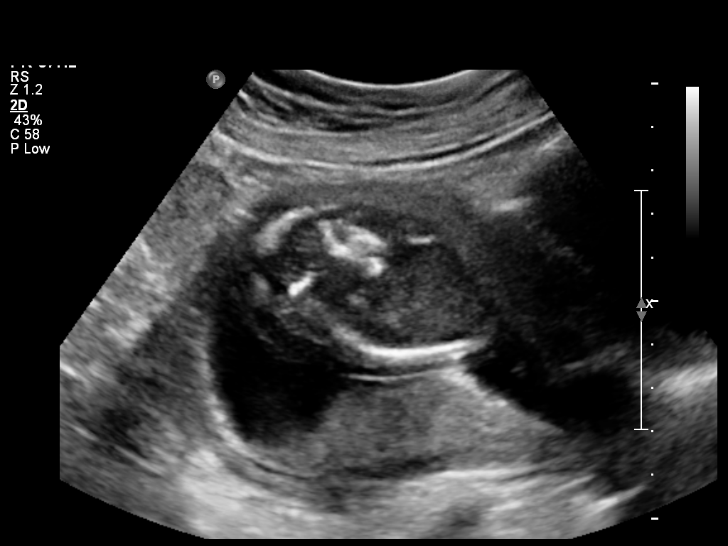
[im 21/28]
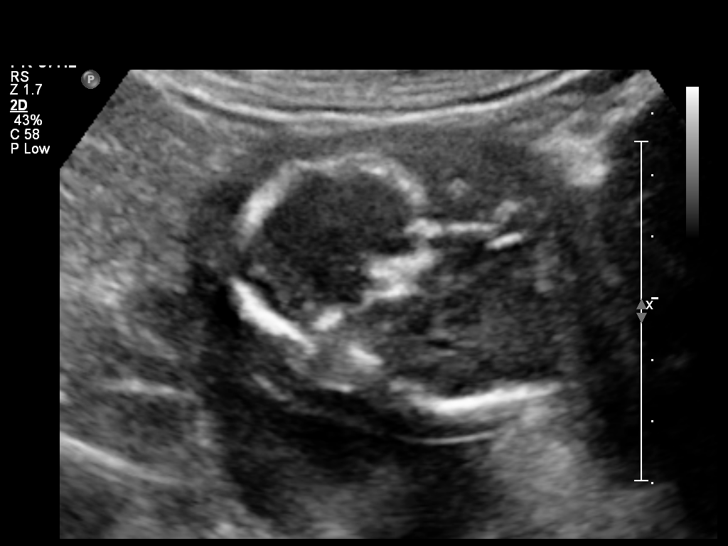
[im 23/28]
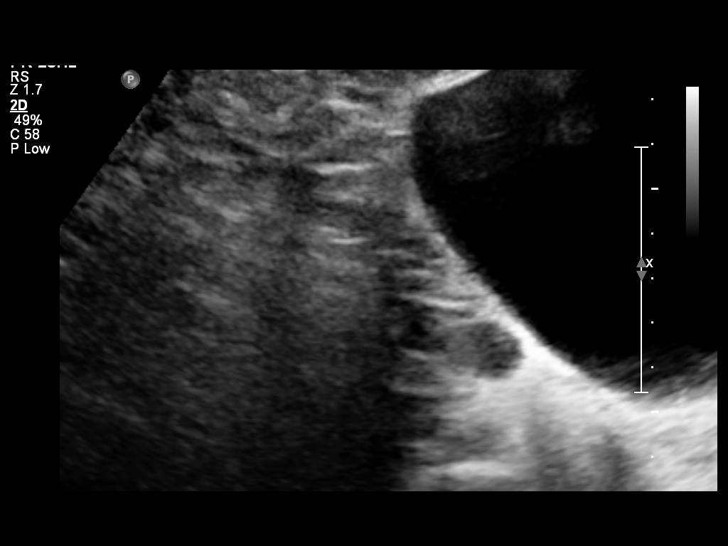
[im 25/28]
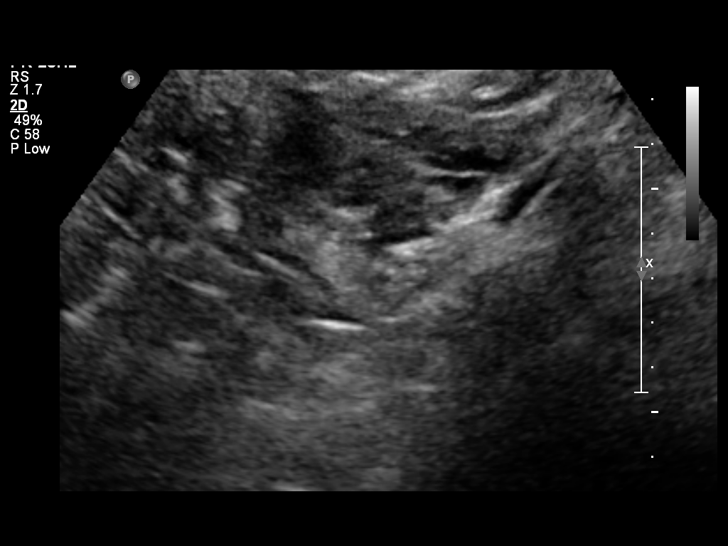
[im 27/28]
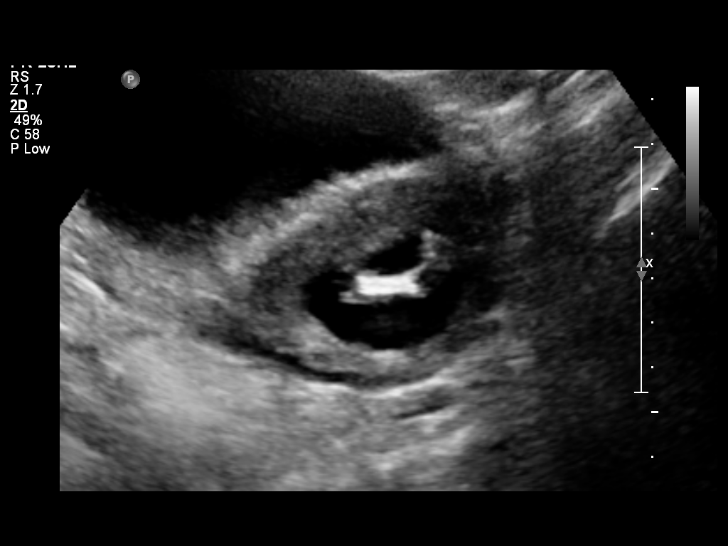

[13 of 28 positions shown; findings below may reference images not displayed]

OBSTETRICS REPORT
                      (Signed Final 04/02/2012 [DATE])

          NAZARETH

                 PA
 Order#:         [id number]
Procedures

 US OB COMP + 14 WK                                    76805.1
Indications

 No fetal cardiac activity detected
Fetal Evaluation

 Cardiac Activity:  Absent
 Presentation:      Breech
 Placenta:          Posterior, above cervical
                    os

 Amniotic Fluid
 AFI FV:      Subjectively within normal limits
Biometry

 BPD:       23  mm    G. Age:   13w 6d                CI:         53.9   70 - 86
                                                      FL/HC:      12.8   13.3 -

 HC:     103.1  mm    G. Age:   14w 6d                HC/AC:      1.17   1.05 -

 AC:      87.9  mm    G. Age:   15w 0d                FL/BPD:
 FL:      13.2  mm    G. Age:   13w 6d                FL/AC:      15.0   20 - 24

 Est. FW:      99  gm      0 lb 3 oz
Gestational Age

 LMP:           16w 1d       Date:   12/11/11                 EDD:   09/16/12
Cervix Uterus Adnexa

 Cervical Length:   3.64      cm

 Cervix:       Normal appearance by transabdominal scan.

 Adnexa:     No abnormality visualized.
Comments

 These results were discussed with Dr. Yilmer and she
 subsequently counseled the patient.
Impression

 Single intrauterine fetal demise with EGA by measurements
 today of 14w 3d. Some structural abnormalities are
 suggested with an abnormal position of the head relative to
 the thorax, questionable cystic hygroma vs. hydropic change
 and possible clubfoot deformity. Chromosomal testing of the
 fetus may be of value as today's findings are suspicious for
 underlying aneuploidy or syndrome.

 LM with us.  Please do not hesitate to

## 2013-02-24 ENCOUNTER — Encounter (HOSPITAL_COMMUNITY): Payer: Self-pay

## 2013-02-24 ENCOUNTER — Inpatient Hospital Stay (HOSPITAL_COMMUNITY)
Admission: AD | Admit: 2013-02-24 | Discharge: 2013-02-25 | DRG: 775 | Disposition: A | Discharge status: Home / Self Care | Payer: Commercial Managed Care - PPO | Source: Ambulatory Visit | Attending: Obstetrics and Gynecology | Admitting: Obstetrics and Gynecology

## 2013-02-24 DIAGNOSIS — O26619 Liver and biliary tract disorders in pregnancy, unspecified trimester: Secondary | ICD-10-CM | POA: Diagnosis present

## 2013-02-24 DIAGNOSIS — O34219 Maternal care for unspecified type scar from previous cesarean delivery: Principal | ICD-10-CM | POA: Diagnosis present

## 2013-02-24 DIAGNOSIS — O09529 Supervision of elderly multigravida, unspecified trimester: Secondary | ICD-10-CM | POA: Diagnosis present

## 2013-02-24 DIAGNOSIS — K838 Other specified diseases of biliary tract: Secondary | ICD-10-CM | POA: Diagnosis present

## 2013-02-24 LAB — RPR: RPR Ser Ql: NONREACTIVE

## 2013-02-24 LAB — CBC
HCT: 37 % (ref 36.0–46.0)
Platelets: 188 10*3/uL (ref 150–400)
RDW: 15.4 % (ref 11.5–15.5)
WBC: 9.6 10*3/uL (ref 4.0–10.5)

## 2013-02-24 MED ORDER — OXYCODONE-ACETAMINOPHEN 5-325 MG PO TABS
1.0000 | ORAL_TABLET | ORAL | Status: DC | PRN
  Administered 2013-02-24: 1 via ORAL
  Filled 2013-02-24: qty 1

## 2013-02-24 MED ORDER — DIPHENHYDRAMINE HCL 25 MG PO CAPS: 25.0000 mg | ORAL_CAPSULE | Freq: Four times a day (QID) | ORAL | Status: DC | PRN

## 2013-02-24 MED ORDER — ONDANSETRON HCL 4 MG PO TABS: 4.0000 mg | ORAL_TABLET | ORAL | Status: DC | PRN

## 2013-02-24 MED ORDER — ACETAMINOPHEN 325 MG PO TABS: 650.0000 mg | ORAL_TABLET | ORAL | Status: DC | PRN

## 2013-02-24 MED ORDER — LACTATED RINGERS IV SOLN
INTRAVENOUS | Status: DC
  Administered 2013-02-24: 06:00:00 via INTRAVENOUS

## 2013-02-24 MED ORDER — DIBUCAINE 1 % RE OINT: 1.0000 "application " | TOPICAL_OINTMENT | RECTAL | Status: DC | PRN

## 2013-02-24 MED ORDER — SENNOSIDES-DOCUSATE SODIUM 8.6-50 MG PO TABS
2.0000 | ORAL_TABLET | Freq: Every day | ORAL | Status: DC
  Administered 2013-02-24: 2 via ORAL

## 2013-02-24 MED ORDER — LIDOCAINE HCL (PF) 1 % IJ SOLN
30.0000 mL | INTRAMUSCULAR | Status: DC | PRN
  Filled 2013-02-24 (×2): qty 30

## 2013-02-24 MED ORDER — BENZOCAINE-MENTHOL 20-0.5 % EX AERO
1.0000 "application " | INHALATION_SPRAY | CUTANEOUS | Status: DC | PRN
  Administered 2013-02-24: 1 via TOPICAL
  Filled 2013-02-24: qty 56

## 2013-02-24 MED ORDER — SIMETHICONE 80 MG PO CHEW: 80.0000 mg | CHEWABLE_TABLET | ORAL | Status: DC | PRN

## 2013-02-24 MED ORDER — LACTATED RINGERS IV SOLN: 500.0000 mL | INTRAVENOUS | Status: DC | PRN

## 2013-02-24 MED ORDER — CITRIC ACID-SODIUM CITRATE 334-500 MG/5ML PO SOLN: 30.0000 mL | ORAL | Status: DC | PRN

## 2013-02-24 MED ORDER — WITCH HAZEL-GLYCERIN EX PADS: 1.0000 "application " | MEDICATED_PAD | CUTANEOUS | Status: DC | PRN

## 2013-02-24 MED ORDER — FLEET ENEMA 7-19 GM/118ML RE ENEM: 1.0000 | ENEMA | RECTAL | Status: DC | PRN

## 2013-02-24 MED ORDER — OXYTOCIN 40 UNITS IN LACTATED RINGERS INFUSION - SIMPLE MED
62.5000 mL/h | INTRAVENOUS | Status: DC
  Administered 2013-02-24: 62.5 mL/h via INTRAVENOUS
  Filled 2013-02-24: qty 1000

## 2013-02-24 MED ORDER — OXYTOCIN BOLUS FROM INFUSION: 500.0000 mL | INTRAVENOUS | Status: DC

## 2013-02-24 MED ORDER — ZOLPIDEM TARTRATE 5 MG PO TABS: 5.0000 mg | ORAL_TABLET | Freq: Every evening | ORAL | Status: DC | PRN

## 2013-02-24 MED ORDER — LANOLIN HYDROUS EX OINT: TOPICAL_OINTMENT | CUTANEOUS | Status: DC | PRN

## 2013-02-24 MED ORDER — TETANUS-DIPHTH-ACELL PERTUSSIS 5-2.5-18.5 LF-MCG/0.5 IM SUSP: 0.5000 mL | Freq: Once | INTRAMUSCULAR | Status: DC

## 2013-02-24 MED ORDER — ONDANSETRON HCL 4 MG/2ML IJ SOLN: 4.0000 mg | INTRAMUSCULAR | Status: DC | PRN

## 2013-02-24 MED ORDER — OXYCODONE-ACETAMINOPHEN 5-325 MG PO TABS: 1.0000 | ORAL_TABLET | ORAL | Status: DC | PRN

## 2013-02-24 MED ORDER — IBUPROFEN 600 MG PO TABS
600.0000 mg | ORAL_TABLET | Freq: Four times a day (QID) | ORAL | Status: DC | PRN
  Administered 2013-02-24: 600 mg via ORAL
  Filled 2013-02-24: qty 1

## 2013-02-24 MED ORDER — ONDANSETRON HCL 4 MG/2ML IJ SOLN: 4.0000 mg | Freq: Four times a day (QID) | INTRAMUSCULAR | Status: DC | PRN

## 2013-02-24 MED ORDER — PRENATAL MULTIVITAMIN CH
1.0000 | ORAL_TABLET | Freq: Every day | ORAL | Status: DC
  Administered 2013-02-24 – 2013-02-25 (×2): 1 via ORAL
  Filled 2013-02-24 (×2): qty 1

## 2013-02-24 MED ORDER — IBUPROFEN 600 MG PO TABS
600.0000 mg | ORAL_TABLET | Freq: Four times a day (QID) | ORAL | Status: DC
  Administered 2013-02-24 – 2013-02-25 (×5): 600 mg via ORAL
  Filled 2013-02-24 (×5): qty 1

## 2013-02-24 NOTE — H&P (Signed)
Erin Dickerson is a 37 y.o. female [id number] at 75 6/7 weeks (EDD 03/11/13 by LMP c/w 19 week [place]) presented to [facility] in active labor with cervical dilation at 8 cm.  Prenatal care complicated by late entry at 19 weeks and cholestasis diagnosed by elevated bile acids about a week ago.  NST's have been reactive. She had a c-section with her first pregnancy followed by successful VBAC and wished another VBAC, signing a consent.  She has decided she does not want her tubes tied.   Maternal Medical History:  Reason for admission: Contractions.   Contractions: Onset was 3-5 hours ago.   Frequency: regular.   Perceived severity is strong.    Fetal activity: Perceived fetal activity is normal.    Prenatal Complications - Diabetes: none.    OB History   Grav Para Term Preterm Abortions TAB SAB Ect Mult Living   4 2 2  1  1   2     2004 LTCS for breech 7# 2007 VBAC 6# 2013 fetal demise with multiple anomalies at 14 weeks D&C normal chromosomes  History reviewed. No pertinent past medical history. Past Surgical History  Procedure Laterality Date  . Cesarean section  2004  . Dilation and evacuation  04/03/2012    Procedure: DILATATION AND EVACUATION (D&E) 2ND TRIMESTER;  Surgeon: Catalina Antigua, MD;  Location: [facility];  Service: Gynecology;  Laterality: N/A;  with ultrasound/chromosome studies   Family History: family history is not on file. Social History:  reports that she has never smoked. She does not have any smokeless tobacco history on file. She reports that she does not drink alcohol or use illicit drugs.   Prenatal Transfer Tool  Maternal Diabetes: No Genetic Screening: Normal Maternal Ultrasounds/Referrals: Normal Fetal Ultrasounds or other Referrals:  None Maternal Substance Abuse:  No Significant Maternal Medications:  None Significant Maternal Lab Results:  None Other Comments:  None  ROS  Dilation: 10 Effacement (%): 100 Station: -2 Exam by:: Lucy Chris RNC Blood  pressure 126/66, pulse 84, temperature 98.4 F (36.9 C), temperature source Oral, resp. rate 20, height 5' (1.524 m), weight 83.008 kg (183 lb), last menstrual period 07/05/2012, not currently breastfeeding. Maternal Exam:  Uterine Assessment: Contraction strength is firm.  Contraction frequency is regular.   Abdomen: Patient reports no abdominal tenderness. Fetal presentation: vertex  Introitus: Normal vulva. Normal vagina.    Physical Exam  Constitutional: She appears well-developed and well-nourished.  Cardiovascular: Normal rate and regular rhythm.   Respiratory: Effort normal and breath sounds normal.  GI: Soft.  Genitourinary: Vagina normal and uterus normal.  Neurological: She is alert.  Psychiatric: She has a normal mood and affect.  In active labor    Prenatal labs: ABO, Rh: O/Positive/-- (03/24 0000) Antibody:   negative Rubella:  Immune RPR:   Neg HBsAg:   Neg HIV:   Neg GBS:   Neg Declined genetic screens One hour GTT 118  Assessment/Plan: Pt was admitted in active labor, and rapidly progressed to complete dilation.  I arrived to find the pt completely dilated and bearing down.  I ruptured membranes and noted light meconium stained fluid. Pt went on to deliver quickly.   Oliver Pila 02/24/2013, 6:18 AM

## 2013-02-24 NOTE — [facility]U Note (Signed)
SAYS SHE HAS BEEN HURTING BAD SINCE 0330.   VE IN OFFICE ON Monday-5 CM.  DENIES HSV AND MRSA.   WOULD NOT SIT DOWN IN TRIAGE-  USED INTERPRETER-  ANA.

## 2013-02-25 LAB — CBC
HCT: 28.3 % — ABNORMAL LOW (ref 36.0–46.0)
Hemoglobin: 9.6 g/dL — ABNORMAL LOW (ref 12.0–15.0)
MCH: 29.8 pg (ref 26.0–34.0)
MCV: 87.9 fL (ref 78.0–100.0)
Platelets: 176 10*3/uL (ref 150–400)
RBC: 3.22 MIL/uL — ABNORMAL LOW (ref 3.87–5.11)
WBC: 9.2 10*3/uL (ref 4.0–10.5)

## 2013-02-25 MED ORDER — IBUPROFEN 600 MG PO TABS: 600.0000 mg | ORAL_TABLET | Freq: Four times a day (QID) | ORAL | Status: DC

## 2013-02-25 MED ORDER — KETOROLAC TROMETHAMINE 30 MG/ML IJ SOLN
INTRAMUSCULAR | Status: AC
  Filled 2013-02-25: qty 1

## 2013-02-25 NOTE — Progress Notes (Signed)
Post Partum Day 1 Subjective: no complaints and tolerating PO Requests early d/c if baby able  Objective: Blood pressure 116/67, pulse 72, temperature 97.9 F (36.6 C), temperature source Oral, resp. rate 18, height 5' (1.524 m), weight 83.008 kg (183 lb), last menstrual period 07/05/2012, SpO2 100.00%, unknown if currently breastfeeding.  Physical Exam:  General: alert and cooperative Lochia: appropriate Uterine Fundus: firm    Recent Labs  02/24/13 0515 02/25/13 0555  HGB 12.8 9.6*  HCT 37.0 28.3*    Assessment/Plan: Discharge home if baby able   LOS: 1 day   Erin Dickerson W 02/25/2013, 9:31 AM

## 2013-02-25 NOTE — Progress Notes (Signed)
Reviewed D/C instructions with pt and FOB with Spanish Interpreter.  Pt verbalized understanding and copy of instructions given to pt.  Make appt with Dr Berenda Morale office in six weeks for follow up and Mirena placement.  Infant to remain inpatient due to bilirubin issues.  Pt verbalizes understanding of how Baby Patient functions on Mother/Baby.

## 2013-02-25 NOTE — Discharge Summary (Signed)
Obstetric Discharge Summary Reason for Admission: onset of labor Prenatal Procedures: none Intrapartum Procedures: VBAC Postpartum Procedures: none Complications-Operative and Postpartum: none Hemoglobin  Date Value Range Status  02/25/2013 9.6* 12.0 - 15.0 g/dL Final     DELTA CHECK NOTED     REPEATED TO VERIFY     HCT  Date Value Range Status  02/25/2013 28.3* 36.0 - 46.0 % Final    Physical Exam:  General: alert and cooperative Lochia: appropriate Uterine Fundus: firm   Discharge Diagnoses: Term Pregnancy-delivered  Discharge Information: Date: 02/25/2013 Activity: pelvic rest Diet: routine Medications: Ibuprofen Condition: improved Instructions: refer to practice specific booklet Discharge to: home Follow-up Information   Follow up with Oliver Pila, MD. Call in 6 weeks. (Postpartum exam and Mirena insertion)    Contact information:   [address], [address] [place] [address] [phone]       Newborn Data: Live born female  Birth Weight: 8 lb 5.3 oz (3779 g) APGAR: 9, 9  Home with mother.  Oliver Pila 02/25/2013, 9:33 AM

## 2013-02-26 ENCOUNTER — Ambulatory Visit: Payer: Self-pay

## 2013-02-26 NOTE — Lactation Note (Signed)
This note was copied from the chart of Erin Sharetta Ricchio. Lactation Consultation Note Mom states that they are following the feeding plan from [facility] yesterday. Mom states baby is latching to the breast, and dad is finger feeding formula while mom pumps. Mom and dad understand and agree with the feeding plan. Questions answered.  Enc mom to call for assistance with a feeding if she has any concerns. \  Patient Name: Erin Dickerson [id number] Date: 02/26/2013     Maternal Data    Feeding Feeding Type: Formula Feeding method: SNS (while nippling FOB's finger) Length of feed: 15 min  LATCH Score/Interventions Latch: Grasps breast easily, tongue down, lips flanged, rhythmical sucking.  Audible Swallowing: A few with stimulation  Type of Nipple: Everted at rest and after stimulation  Comfort (Breast/Nipple): Filling, red/small blisters or bruises, mild/mod discomfort  Problem noted: Mild/Moderate discomfort Interventions (Mild/moderate discomfort):  (assisted mother to get deeper latch)  Hold (Positioning): Assistance needed to correctly position infant at breast and maintain latch.  LATCH Score: 7  Lactation Tools Discussed/Used     Consult Status      Octavio Manns [facility] 02/26/2013, 11:41 AM

## 2013-02-27 ENCOUNTER — Ambulatory Visit: Payer: Self-pay

## 2013-02-27 NOTE — Lactation Note (Signed)
This note was copied from the chart of Erin Dickerson. Lactation Consultation Note  Patient Name: Erin Dickerson [id number] Date: 02/27/2013 Reason for consult: Follow-up assessment   Maternal Data    Feeding   LATCH Score/Interventions Latch: Grasps breast easily, tongue down, lips flanged, rhythmical sucking.  Audible Swallowing: A few with stimulation  Type of Nipple: Everted at rest and after stimulation  Comfort (Breast/Nipple): Soft / non-tender     Hold (Positioning): No assistance needed to correctly position infant at breast. Intervention(s): Breastfeeding basics reviewed;Support Pillows  LATCH Score: 9  Lactation Tools Discussed/Used     Consult Status Consult Status: Complete  Bili lights have been DC'd and baby to be DC'd today. Experienced BF mom. Mom reports that breasts are getting a little fuller this morning. Dad has been giving supplement- EBM or formula. Mom pumped 15 cc's this morning. Reports that she pumped 4 times yesterday because baby was too sleepy. Reports that she has appointment to sign up for [facility] on Wednesday. Plans to use manual pump at home. Reviewed frequent breast feedings to promote milk supply and decrease bilirubin levels. Dr. Manson Passey asking about finger feeding with feeding tube and syringe. Parents instructed in setup, use and cleaning of feeding tube/syringe to supplement baby by finger feeding. No questions at present. To call prn To come back here tomorrow to recheck bili levels and to see Ped on Monday.  Pamelia Hoit 02/27/2013, 10:09 AM

## 2013-09-02 NOTE — L&D Delivery Note (Signed)
Delivery Note Pt progressed quickly and pushed well.  At 9:47 AM a viable female was delivered via VBAC, Spontaneous (Presentation: ; Occiput Anterior).  APGAR: 9, 9; weight pending.   Placenta status: Intact, Spontaneous.  Cord:  with the following complications: None.  Anesthesia: None  Episiotomy: None Lacerations: None Suture Repair: none Est. Blood Loss (mL): 400  Mom to postpartum.  Baby to Couplet care / Skin to Skin.  Does not want circumcision.  Wants Ess[facility] in the office after discussion of options for BTL.  Daziya Redmond D 04/14/2014, 10:15 AM

## 2013-11-16 LAB — OB RESULTS CONSOLE RUBELLA ANTIBODY, IGM: Rubella: IMMUNE

## 2013-11-16 LAB — OB RESULTS CONSOLE HIV ANTIBODY (ROUTINE TESTING): HIV: NONREACTIVE

## 2013-11-16 LAB — OB RESULTS CONSOLE RPR: RPR: NONREACTIVE

## 2013-11-16 LAB — OB RESULTS CONSOLE ABO/RH: RH Type: POSITIVE

## 2013-11-16 LAB — OB RESULTS CONSOLE ANTIBODY SCREEN: ANTIBODY SCREEN: NEGATIVE

## 2013-11-16 LAB — OB RESULTS CONSOLE GC/CHLAMYDIA
CHLAMYDIA, DNA PROBE: NEGATIVE
Gonorrhea: NEGATIVE

## 2013-11-16 LAB — OB RESULTS CONSOLE HEPATITIS B SURFACE ANTIGEN: Hepatitis B Surface Ag: NEGATIVE

## 2014-04-07 LAB — OB RESULTS CONSOLE GBS: STREP GROUP B AG: NEGATIVE

## 2014-04-11 ENCOUNTER — Telehealth (HOSPITAL_COMMUNITY): Payer: Self-pay | Admitting: *Deleted

## 2014-04-11 ENCOUNTER — Encounter (HOSPITAL_COMMUNITY): Payer: Self-pay | Admitting: *Deleted

## 2014-04-11 NOTE — Telephone Encounter (Signed)
Preadmission screen [id number] interpreter number

## 2014-04-14 ENCOUNTER — Inpatient Hospital Stay (HOSPITAL_COMMUNITY)
Admission: RE | Admit: 2014-04-14 | Discharge: 2014-04-15 | DRG: 775 | Disposition: A | Discharge status: Home / Self Care | Payer: Commercial Managed Care - PPO | Source: Ambulatory Visit | Attending: Obstetrics and Gynecology | Admitting: Obstetrics and Gynecology

## 2014-04-14 ENCOUNTER — Encounter (HOSPITAL_COMMUNITY): Payer: Self-pay

## 2014-04-14 VITALS — BP 107/52 | HR 64 | Temp 97.8°F | Resp 20 | Ht 59.0 in | Wt 172.0 lb

## 2014-04-14 DIAGNOSIS — O26619 Liver and biliary tract disorders in pregnancy, unspecified trimester: Principal | ICD-10-CM | POA: Diagnosis present

## 2014-04-14 DIAGNOSIS — O09529 Supervision of elderly multigravida, unspecified trimester: Secondary | ICD-10-CM | POA: Diagnosis present

## 2014-04-14 DIAGNOSIS — O34219 Maternal care for unspecified type scar from previous cesarean delivery: Secondary | ICD-10-CM | POA: Diagnosis present

## 2014-04-14 DIAGNOSIS — K838 Other specified diseases of biliary tract: Secondary | ICD-10-CM | POA: Diagnosis present

## 2014-04-14 DIAGNOSIS — O26613 Liver and biliary tract disorders in pregnancy, third trimester: Secondary | ICD-10-CM

## 2014-04-14 DIAGNOSIS — K831 Obstruction of bile duct: Secondary | ICD-10-CM

## 2014-04-14 LAB — CBC
HCT: 38 % (ref 36.0–46.0)
Hemoglobin: 12.9 g/dL (ref 12.0–15.0)
MCH: 29.8 pg (ref 26.0–34.0)
MCHC: 33.9 g/dL (ref 30.0–36.0)
MCV: 87.8 fL (ref 78.0–100.0)
Platelets: 189 10*3/uL (ref 150–400)
RBC: 4.33 MIL/uL (ref 3.87–5.11)
RDW: 15.6 % — ABNORMAL HIGH (ref 11.5–15.5)
WBC: 6.6 10*3/uL (ref 4.0–10.5)

## 2014-04-14 LAB — TYPE AND SCREEN
ABO/RH(D): O POS
Antibody Screen: NEGATIVE

## 2014-04-14 LAB — RPR

## 2014-04-14 MED ORDER — OXYCODONE-ACETAMINOPHEN 5-325 MG PO TABS: 1.0000 | ORAL_TABLET | ORAL | Status: DC | PRN

## 2014-04-14 MED ORDER — LACTATED RINGERS IV SOLN: 500.0000 mL | INTRAVENOUS | Status: DC | PRN

## 2014-04-14 MED ORDER — TETANUS-DIPHTH-ACELL PERTUSSIS 5-2.5-18.5 LF-MCG/0.5 IM SUSP
0.5000 mL | Freq: Once | INTRAMUSCULAR | Status: AC
  Administered 2014-04-15: 0.5 mL via INTRAMUSCULAR

## 2014-04-14 MED ORDER — WITCH HAZEL-GLYCERIN EX PADS: 1.0000 "application " | MEDICATED_PAD | CUTANEOUS | Status: DC | PRN

## 2014-04-14 MED ORDER — ONDANSETRON HCL 4 MG/2ML IJ SOLN: 4.0000 mg | INTRAMUSCULAR | Status: DC | PRN

## 2014-04-14 MED ORDER — OXYTOCIN 40 UNITS IN LACTATED RINGERS INFUSION - SIMPLE MED
1.0000 m[IU]/min | INTRAVENOUS | Status: DC
  Administered 2014-04-14: 2 m[IU]/min via INTRAVENOUS

## 2014-04-14 MED ORDER — DIBUCAINE 1 % RE OINT: 1.0000 "application " | TOPICAL_OINTMENT | RECTAL | Status: DC | PRN

## 2014-04-14 MED ORDER — CITRIC ACID-SODIUM CITRATE 334-500 MG/5ML PO SOLN: 30.0000 mL | ORAL | Status: DC | PRN

## 2014-04-14 MED ORDER — TERBUTALINE SULFATE 1 MG/ML IJ SOLN: 0.2500 mg | Freq: Once | INTRAMUSCULAR | Status: DC | PRN

## 2014-04-14 MED ORDER — OXYTOCIN BOLUS FROM INFUSION: 500.0000 mL | INTRAVENOUS | Status: DC

## 2014-04-14 MED ORDER — LANOLIN HYDROUS EX OINT: TOPICAL_OINTMENT | CUTANEOUS | Status: DC | PRN

## 2014-04-14 MED ORDER — IBUPROFEN 600 MG PO TABS
600.0000 mg | ORAL_TABLET | Freq: Four times a day (QID) | ORAL | Status: DC | PRN
  Administered 2014-04-14: 600 mg via ORAL
  Filled 2014-04-14: qty 1

## 2014-04-14 MED ORDER — SIMETHICONE 80 MG PO CHEW: 80.0000 mg | CHEWABLE_TABLET | ORAL | Status: DC | PRN

## 2014-04-14 MED ORDER — BUTORPHANOL TARTRATE 1 MG/ML IJ SOLN: 1.0000 mg | INTRAMUSCULAR | Status: DC | PRN

## 2014-04-14 MED ORDER — ACETAMINOPHEN 325 MG PO TABS: 650.0000 mg | ORAL_TABLET | ORAL | Status: DC | PRN

## 2014-04-14 MED ORDER — ZOLPIDEM TARTRATE 5 MG PO TABS: 5.0000 mg | ORAL_TABLET | Freq: Every evening | ORAL | Status: DC | PRN

## 2014-04-14 MED ORDER — MEASLES, MUMPS & RUBELLA VAC ~~LOC~~ INJ: 0.5000 mL | INJECTION | Freq: Once | SUBCUTANEOUS | Status: DC

## 2014-04-14 MED ORDER — LACTATED RINGERS IV SOLN
INTRAVENOUS | Status: DC
  Administered 2014-04-14: 08:00:00 via INTRAVENOUS

## 2014-04-14 MED ORDER — BENZOCAINE-MENTHOL 20-0.5 % EX AERO: 1.0000 "application " | INHALATION_SPRAY | CUTANEOUS | Status: DC | PRN

## 2014-04-14 MED ORDER — IBUPROFEN 600 MG PO TABS
600.0000 mg | ORAL_TABLET | Freq: Four times a day (QID) | ORAL | Status: DC
  Administered 2014-04-14 – 2014-04-15 (×3): 600 mg via ORAL
  Filled 2014-04-14 (×3): qty 1

## 2014-04-14 MED ORDER — ONDANSETRON HCL 4 MG/2ML IJ SOLN: 4.0000 mg | Freq: Four times a day (QID) | INTRAMUSCULAR | Status: DC | PRN

## 2014-04-14 MED ORDER — MAGNESIUM HYDROXIDE 400 MG/5ML PO SUSP: 30.0000 mL | ORAL | Status: DC | PRN

## 2014-04-14 MED ORDER — LIDOCAINE HCL (PF) 1 % IJ SOLN
30.0000 mL | INTRAMUSCULAR | Status: DC | PRN
  Filled 2014-04-14: qty 30

## 2014-04-14 MED ORDER — OXYTOCIN 40 UNITS IN LACTATED RINGERS INFUSION - SIMPLE MED
62.5000 mL/h | INTRAVENOUS | Status: DC
  Filled 2014-04-14: qty 1000

## 2014-04-14 MED ORDER — SENNOSIDES-DOCUSATE SODIUM 8.6-50 MG PO TABS
2.0000 | ORAL_TABLET | ORAL | Status: DC
  Administered 2014-04-14: 2 via ORAL
  Filled 2014-04-14: qty 2

## 2014-04-14 MED ORDER — DIPHENHYDRAMINE HCL 25 MG PO CAPS: 25.0000 mg | ORAL_CAPSULE | Freq: Four times a day (QID) | ORAL | Status: DC | PRN

## 2014-04-14 MED ORDER — PRENATAL MULTIVITAMIN CH: 1.0000 | ORAL_TABLET | Freq: Every day | ORAL | Status: DC

## 2014-04-14 MED ORDER — ONDANSETRON HCL 4 MG PO TABS: 4.0000 mg | ORAL_TABLET | ORAL | Status: DC | PRN

## 2014-04-14 MED ORDER — METHYLERGONOVINE MALEATE 0.2 MG/ML IJ SOLN: 0.2000 mg | INTRAMUSCULAR | Status: DC | PRN

## 2014-04-14 MED ORDER — METHYLERGONOVINE MALEATE 0.2 MG PO TABS: 0.2000 mg | ORAL_TABLET | ORAL | Status: DC | PRN

## 2014-04-14 NOTE — H&P (Signed)
Erin Dickerson is a 38 y.o. female, G5 P3013, EGA [redacted] weeks with EDC 9-1 presenting for induction due to cholestasis of pregnancy.  Pregnancy otherwise uncomplicated except she has has had one c-section and 2 VBACs, see prenatal records for complete history. History OB History   Grav Para Term Preterm Abortions TAB SAB Ect Mult Living   5 3 3  1  1   3     LTCS for breech VBAC x 2  Past Medical History  Diagnosis Date  . Melanocytic nevus   . Medical history non-contributory    Past Surgical History  Procedure Laterality Date  . Cesarean section  2004  . Dilation and evacuation  04/03/2012    Procedure: DILATATION AND EVACUATION (D&E) 2ND TRIMESTER;  Surgeon: Mora Bellman, MD;  Location: [facility];  Service: Gynecology;  Laterality: N/A;  with ultrasound/chromosome studies   Family History: family history is not on file. Social History:  reports that she has never smoked. She does not have any smokeless tobacco history on file. She reports that she does not drink alcohol or use illicit drugs.   Prenatal Transfer Tool  Maternal Diabetes: No Genetic Screening: Declined Maternal Ultrasounds/Referrals: Normal Fetal Ultrasounds or other Referrals:  None Maternal Substance Abuse:  No Significant Maternal Medications:  None Significant Maternal Lab Results:  Lab values include: Group B Strep negative Other Comments:  None  Review of Systems  Respiratory: Negative.   Cardiovascular: Negative.    AROM-clear Dilation: 6 Effacement (%): 80 Station: 0 Exam by:: Shelly Shoultz Blood pressure 114/72, pulse 81, temperature 98.3 F (36.8 C), temperature source Oral, resp. rate 20, height 4\' 11"  (1.499 m), weight 78.019 kg (172 lb), last menstrual period 07/27/2013, unknown if currently breastfeeding. Maternal Exam:  Uterine Assessment: Contraction strength is mild.  Contraction frequency is irregular.   Abdomen: Surgical scars: low transverse.   Estimated fetal weight is 7 lbs.    Fetal presentation: vertex  Introitus: Normal vulva. Normal vagina.  Amniotic fluid character: clear.  Pelvis: adequate for delivery.   Cervix: Cervix evaluated by digital exam.     Fetal Exam Fetal Monitor Review: Mode: ultrasound.   Baseline rate: 140.  Variability: minimal (<5 bpm).   Pattern: no accelerations and no decelerations.    Fetal State Assessment: Category II - tracings are indeterminate.     Physical Exam  Constitutional: She appears well-developed and well-nourished.  Cardiovascular: Normal rate, regular rhythm and normal heart sounds.   No murmur heard. Respiratory: Effort normal and breath sounds normal. No respiratory distress. She has no wheezes.  GI: Soft.  Gravid     Prenatal labs: ABO, Rh: O/Positive/-- (03/17 0000) Antibody: Negative (03/17 0000) Rubella: Immune (03/17 0000) RPR: Nonreactive (03/17 0000)  HBsAg: Negative (03/17 0000)  HIV: Non-reactive (03/17 0000)  GBS: Negative (08/06 0000)  GCT:  119  Assessment/Plan: IUP at 37 weeks with cholestasis of pregnancy for induction, this will be VBAC #3.  On pitocin, AROM done, monitor progress and anticipate SVD.   Rumeal Cullipher D 04/14/2014, 8:53 AM

## 2014-04-15 MED ORDER — IBUPROFEN 600 MG PO TABS: 600.0000 mg | ORAL_TABLET | Freq: Four times a day (QID) | ORAL | Status: AC

## 2014-04-15 NOTE — Discharge Summary (Signed)
Obstetric Discharge Summary Reason for Admission: induction of labor Prenatal Procedures: NST Intrapartum Procedures: VBAC Postpartum Procedures: none Complications-Operative and Postpartum: none Hemoglobin  Date Value Ref Range Status  04/14/2014 12.9  12.0 - 15.0 g/dL Final     HCT  Date Value Ref Range Status  04/14/2014 38.0  36.0 - 46.0 % Final    Physical Exam:  General: alert and cooperative Lochia: appropriate Uterine Fundus: firm   Discharge Diagnoses: Term Pregnancy-delivered                                         Cholestasis of pregnancy Discharge Information: Date: 04/15/2014 Activity: pelvic rest Diet: routine Medications: Ibuprofen Condition: improved Instructions: refer to practice specific booklet Discharge to: home Follow-up Information   Follow up with MEISINGER,TODD D, MD In 6 weeks. (posstpartum exam)    Specialty:  Obstetrics and Gynecology   Contact information:   [address], [place] [address] [place] [address] [phone]       Newborn Data: Live born female  Birth Weight: 8 lb 5.5 oz (3785 g) APGAR: 9, 9  Home with mother.  Logan Bores 04/15/2014, 9:29 AM

## 2014-04-15 NOTE — Lactation Note (Signed)
This note was copied from the chart of [place]. Lactation Consultation Note Experienced BF of children for 6 months each, 2 1/2 exclusive then she had to go back to work and gave formula in bottle while working then BF when home. Her 49 month old weaned off d/t mom was pregnant and baby didn't want her breast any more. SHe speaks very good [place] and denied need of interpreter. Gave teaching information in [place]. Mom knowledgeable about hand expression, has good everted nipples. Baby sleeping at this time. Mom states feeding well, but isn't sure if she has enough milk. Explained newborn feeding needs. Mom encouraged to feed baby 8-12 times/24 hours and with feeding cues.  Educated about newborn behavior. Encouraged to call for assistance if needed and to verify proper latch.Reviewed Baby & Me book's Breastfeeding Basics. [place] brochure given w/resources, support groups and [place] services.Mom encouraged to do skin-to-skin. Patient Name: Boy Miniya Miguez [id number] Date: 04/15/2014     Maternal Data    Feeding    LATCH Score/Interventions                      Lactation Tools Discussed/Used     Consult Status      Theodoro Kalata 04/15/2014, 4:13 AM

## 2014-04-15 NOTE — Progress Notes (Signed)
Post Partum Day 1 Subjective: no complaints and tolerating PO  Desires early d/c home  Objective: Blood pressure 107/52, pulse 64, temperature 97.8 F (36.6 C), temperature source Oral, resp. rate 20, height 4\' 11"  (1.499 m), weight 78.019 kg (172 lb), last menstrual period 07/27/2013, SpO2 99.00%, unknown if currently breastfeeding.  Physical Exam:  General: alert and cooperative Lochia: appropriate Uterine Fundus: firm   Recent Labs  04/14/14 0745  HGB 12.9  HCT 38.0    Assessment/Plan: Discharge home D/w pt Essure in spanish and pp interval BTL, she wants to read a bit more about them and then Decide what she would like.   LOS: 1 day   Shawn Dannenberg W 04/15/2014, 9:25 AM

## 2014-07-04 ENCOUNTER — Encounter (HOSPITAL_COMMUNITY): Payer: Self-pay

## 2014-08-15 NOTE — Patient Instructions (Addendum)
   Your procedure is scheduled on:  Wednesday, Dec 16  Enter through the [employer] [facility] [place] at: 7 AM Pick up the phone at the desk and dial [phone] and inform us of your arrival.  Please call this number if you have any problems the morning of surgery: [phone]  Remember: Do not eat or drink after midnight: Tuesday Take these medicines the morning of surgery with a SIP OF WATER: None  Do not wear jewelry, make-up, or FINGER nail polish No metal in your hair or on your body. Do not wear lotions, powders, perfumes.  You may wear deodorant.  Do not bring valuables to the hospital. Contacts, dentures or bridgework may not be worn into surgery.  Patients discharged on the day of surgery will not be allowed to drive home.  Home with husband Rafeal cell [phone]

## 2014-08-16 ENCOUNTER — Encounter (HOSPITAL_COMMUNITY)
Admission: RE | Admit: 2014-08-16 | Discharge: 2014-08-16 | Disposition: A | Discharge status: Home / Self Care | Payer: Commercial Managed Care - PPO | Source: Ambulatory Visit | Attending: Obstetrics and Gynecology | Admitting: Obstetrics and Gynecology

## 2014-08-16 ENCOUNTER — Encounter (HOSPITAL_COMMUNITY): Payer: Self-pay

## 2014-08-16 DIAGNOSIS — E669 Obesity, unspecified: Secondary | ICD-10-CM | POA: Diagnosis not present

## 2014-08-16 DIAGNOSIS — Z6832 Body mass index (BMI) 32.0-32.9, adult: Secondary | ICD-10-CM | POA: Diagnosis not present

## 2014-08-16 DIAGNOSIS — Z302 Encounter for sterilization: Secondary | ICD-10-CM | POA: Diagnosis present

## 2014-08-16 DIAGNOSIS — K660 Peritoneal adhesions (postprocedural) (postinfection): Secondary | ICD-10-CM | POA: Diagnosis not present

## 2014-08-16 LAB — CBC
HCT: 38.3 % (ref 36.0–46.0)
Hemoglobin: 12.8 g/dL (ref 12.0–15.0)
MCH: 30.9 pg (ref 26.0–34.0)
MCHC: 33.4 g/dL (ref 30.0–36.0)
MCV: 92.5 fL (ref 78.0–100.0)
Platelets: 189 10*3/uL (ref 150–400)
RBC: 4.14 MIL/uL (ref 3.87–5.11)
RDW: 13.2 % (ref 11.5–15.5)
WBC: 6.2 10*3/uL (ref 4.0–10.5)

## 2014-08-16 NOTE — H&P (Signed)
Erin Dickerson is an 38 y.o. female. She is s/p VBAC in August, initially wanted [employer]ure in the office, but has changed her mind and wants laparoscopic tubal for permanent sterility.  Pertinent Gynecological History: Last pap: normal Date: 05/2013 OB History: G5,  P4014, c-section, then VBAC x 3   Menstrual History: No LMP recorded.    Past Medical History  Diagnosis Date  . Melanocytic nevus   . SVD (spontaneous vaginal delivery)     x 3    Past Surgical History  Procedure Laterality Date  . Cesarean section  2004  . Dilation and evacuation  04/03/2012    Procedure: DILATATION AND EVACUATION (Dickerson&E) 2ND TRIMESTER;  Surgeon: Mora Bellman, MD;  Location: [facility];  Service: Gynecology;  Laterality: N/A;  with ultrasound/chromosome studies    No family history on file.  Social History:  reports that she has never smoked. She has never used smokeless tobacco. She reports that she does not drink alcohol or use illicit drugs.  Allergies: No Known Allergies  No prescriptions prior to admission    Review of Systems  Respiratory: Negative.   Cardiovascular: Negative.   Gastrointestinal: Negative.   Genitourinary: Negative.     currently breastfeeding. Physical Exam  Constitutional: She appears well-developed and well-nourished.  Neck: Neck supple. No thyromegaly present.  Cardiovascular: Normal rate, regular rhythm and normal heart sounds.   No murmur heard. Respiratory: Effort normal and breath sounds normal. No respiratory distress. She has no wheezes.  GI: Soft. She exhibits no distension and no mass. There is no tenderness.  Transverse scar  Genitourinary: Vagina normal and uterus normal.  No adnexal mass    Results for orders placed or performed during the hospital encounter of 08/16/14 (from the past 24 hour(s))  CBC     Status: None   Collection Time: 08/16/14  9:35 AM  Result Value Ref Range   WBC 6.2 4.0 - 10.5 K/uL   RBC 4.14 3.87 - 5.11 MIL/uL   Hemoglobin 12.8 12.0 - 15.0 g/dL   HCT 38.3 36.0 - 46.0 %   MCV 92.5 78.0 - 100.0 fL   MCH 30.9 26.0 - 34.0 pg   MCHC 33.4 30.0 - 36.0 g/dL   RDW 13.2 11.5 - 15.5 %   Platelets 189 150 - 400 K/uL    No results found.  Assessment/Plan: Desires permanent sterility.  All options, surgical procedure, permanency, failure rate have all been discussed.  Will admit for laparoscopic bilateral salpingectomy.    Erin Dickerson 08/16/2014, 4:34 PM

## 2014-08-17 ENCOUNTER — Ambulatory Visit (HOSPITAL_COMMUNITY): Payer: Commercial Managed Care - PPO | Admitting: Anesthesiology

## 2014-08-17 ENCOUNTER — Encounter (HOSPITAL_COMMUNITY): Payer: Self-pay | Admitting: Anesthesiology

## 2014-08-17 ENCOUNTER — Ambulatory Visit (HOSPITAL_COMMUNITY)
Admission: RE | Admit: 2014-08-17 | Discharge: 2014-08-17 | Disposition: A | Discharge status: Home / Self Care | Payer: Commercial Managed Care - PPO | Source: Ambulatory Visit | Attending: Obstetrics and Gynecology | Admitting: Obstetrics and Gynecology

## 2014-08-17 ENCOUNTER — Encounter (HOSPITAL_COMMUNITY)
Admission: RE | Disposition: A | Discharge status: Home / Self Care | Payer: Self-pay | Source: Ambulatory Visit | Attending: Obstetrics and Gynecology

## 2014-08-17 DIAGNOSIS — K660 Peritoneal adhesions (postprocedural) (postinfection): Secondary | ICD-10-CM | POA: Insufficient documentation

## 2014-08-17 DIAGNOSIS — Z6832 Body mass index (BMI) 32.0-32.9, adult: Secondary | ICD-10-CM | POA: Insufficient documentation

## 2014-08-17 DIAGNOSIS — E669 Obesity, unspecified: Secondary | ICD-10-CM | POA: Insufficient documentation

## 2014-08-17 DIAGNOSIS — Z309 Encounter for contraceptive management, unspecified: Secondary | ICD-10-CM

## 2014-08-17 HISTORY — PX: LAPAROSCOPIC BILATERAL SALPINGECTOMY: SHX5889

## 2014-08-17 LAB — PREGNANCY, URINE: Preg Test, Ur: NEGATIVE

## 2014-08-17 SURGERY — SALPINGECTOMY, BILATERAL, LAPAROSCOPIC
Anesthesia: General | Site: Abdomen | Laterality: Bilateral

## 2014-08-17 MED ORDER — ONDANSETRON HCL 4 MG/2ML IJ SOLN
INTRAMUSCULAR | Status: AC
  Filled 2014-08-17: qty 2

## 2014-08-17 MED ORDER — OXYCODONE HCL 5 MG/5ML PO SOLN: 5.0000 mg | Freq: Once | ORAL | Status: DC | PRN

## 2014-08-17 MED ORDER — BUPIVACAINE HCL (PF) 0.25 % IJ SOLN
INTRAMUSCULAR | Status: AC
  Filled 2014-08-17: qty 30

## 2014-08-17 MED ORDER — DEXAMETHASONE SODIUM PHOSPHATE 10 MG/ML IJ SOLN
INTRAMUSCULAR | Status: DC | PRN
  Administered 2014-08-17: 4 mg via INTRAVENOUS

## 2014-08-17 MED ORDER — METOCLOPRAMIDE HCL 5 MG/ML IJ SOLN: 10.0000 mg | Freq: Once | INTRAMUSCULAR | Status: DC | PRN

## 2014-08-17 MED ORDER — GLYCOPYRROLATE 0.2 MG/ML IJ SOLN
INTRAMUSCULAR | Status: DC | PRN
  Administered 2014-08-17: 0.6 mg via INTRAVENOUS

## 2014-08-17 MED ORDER — SCOPOLAMINE 1 MG/3DAYS TD PT72
1.0000 | MEDICATED_PATCH | Freq: Once | TRANSDERMAL | Status: DC
  Administered 2014-08-17: 1.5 mg via TRANSDERMAL

## 2014-08-17 MED ORDER — LIDOCAINE HCL (CARDIAC) 20 MG/ML IV SOLN
INTRAVENOUS | Status: AC
  Filled 2014-08-17: qty 5

## 2014-08-17 MED ORDER — LACTATED RINGERS IV SOLN
INTRAVENOUS | Status: DC
  Administered 2014-08-17 (×2): via INTRAVENOUS

## 2014-08-17 MED ORDER — PROPOFOL 10 MG/ML IV BOLUS
INTRAVENOUS | Status: DC | PRN
  Administered 2014-08-17: 150 mg via INTRAVENOUS

## 2014-08-17 MED ORDER — KETOROLAC TROMETHAMINE 30 MG/ML IJ SOLN
INTRAMUSCULAR | Status: DC | PRN
  Administered 2014-08-17: 30 mg via INTRAVENOUS

## 2014-08-17 MED ORDER — MIDAZOLAM HCL 2 MG/2ML IJ SOLN
INTRAMUSCULAR | Status: AC
  Filled 2014-08-17: qty 2

## 2014-08-17 MED ORDER — FENTANYL CITRATE 0.05 MG/ML IJ SOLN
INTRAMUSCULAR | Status: DC | PRN
  Administered 2014-08-17 (×2): 50 ug via INTRAVENOUS
  Administered 2014-08-17: 25 ug via INTRAVENOUS

## 2014-08-17 MED ORDER — FENTANYL CITRATE 0.05 MG/ML IJ SOLN
INTRAMUSCULAR | Status: AC
  Filled 2014-08-17: qty 5

## 2014-08-17 MED ORDER — PROPOFOL 10 MG/ML IV EMUL
INTRAVENOUS | Status: AC
  Filled 2014-08-17: qty 20

## 2014-08-17 MED ORDER — ONDANSETRON HCL 4 MG/2ML IJ SOLN
INTRAMUSCULAR | Status: DC | PRN
  Administered 2014-08-17: 4 mg via INTRAVENOUS

## 2014-08-17 MED ORDER — MIDAZOLAM HCL 2 MG/2ML IJ SOLN
INTRAMUSCULAR | Status: DC | PRN
  Administered 2014-08-17: 1 mg via INTRAVENOUS

## 2014-08-17 MED ORDER — ROCURONIUM BROMIDE 100 MG/10ML IV SOLN
INTRAVENOUS | Status: DC | PRN
  Administered 2014-08-17: 25 mg via INTRAVENOUS

## 2014-08-17 MED ORDER — DEXAMETHASONE SODIUM PHOSPHATE 4 MG/ML IJ SOLN
INTRAMUSCULAR | Status: AC
  Filled 2014-08-17: qty 1

## 2014-08-17 MED ORDER — HYDROCODONE-ACETAMINOPHEN 5-325 MG PO TABS: 1.0000 | ORAL_TABLET | Freq: Four times a day (QID) | ORAL | Status: AC | PRN

## 2014-08-17 MED ORDER — SCOPOLAMINE 1 MG/3DAYS TD PT72
MEDICATED_PATCH | TRANSDERMAL | Status: AC
  Filled 2014-08-17: qty 1

## 2014-08-17 MED ORDER — OXYCODONE HCL 5 MG PO TABS: 5.0000 mg | ORAL_TABLET | Freq: Once | ORAL | Status: DC | PRN

## 2014-08-17 MED ORDER — LIDOCAINE HCL (CARDIAC) 20 MG/ML IV SOLN
INTRAVENOUS | Status: DC | PRN
  Administered 2014-08-17: 20 mg via INTRAVENOUS
  Administered 2014-08-17 (×2): 40 mg via INTRAVENOUS

## 2014-08-17 MED ORDER — NEOSTIGMINE METHYLSULFATE 10 MG/10ML IV SOLN
INTRAVENOUS | Status: AC
  Filled 2014-08-17: qty 1

## 2014-08-17 MED ORDER — KETOROLAC TROMETHAMINE 30 MG/ML IJ SOLN
INTRAMUSCULAR | Status: AC
  Filled 2014-08-17: qty 1

## 2014-08-17 MED ORDER — BUPIVACAINE HCL (PF) 0.25 % IJ SOLN
INTRAMUSCULAR | Status: DC | PRN
  Administered 2014-08-17: 13 mL

## 2014-08-17 MED ORDER — FENTANYL CITRATE 0.05 MG/ML IJ SOLN: 25.0000 ug | INTRAMUSCULAR | Status: DC | PRN

## 2014-08-17 MED ORDER — ROCURONIUM BROMIDE 100 MG/10ML IV SOLN
INTRAVENOUS | Status: AC
  Filled 2014-08-17: qty 1

## 2014-08-17 MED ORDER — GLYCOPYRROLATE 0.2 MG/ML IJ SOLN
INTRAMUSCULAR | Status: AC
  Filled 2014-08-17: qty 3

## 2014-08-17 MED ORDER — MEPERIDINE HCL 25 MG/ML IJ SOLN: 6.2500 mg | INTRAMUSCULAR | Status: DC | PRN

## 2014-08-17 MED ORDER — NEOSTIGMINE METHYLSULFATE 10 MG/10ML IV SOLN
INTRAVENOUS | Status: DC | PRN
  Administered 2014-08-17: 3 mg via INTRAVENOUS

## 2014-08-17 SURGICAL SUPPLY — 34 items
BAG SPEC RTRVL LRG 6X4 10 (ENDOMECHANICALS)
BLADE SURG 11 STRL SS (BLADE) ×3 IMPLANT
CABLE HIGH FREQUENCY MONO STRZ (ELECTRODE) IMPLANT
CATH ROBINSON RED A/P 16FR (CATHETERS) ×2 IMPLANT
CHLORAPREP W/TINT 26ML (MISCELLANEOUS) ×3 IMPLANT
CLOTH BEACON ORANGE TIMEOUT ST (SAFETY) ×3 IMPLANT
DECANTER SPIKE VIAL GLASS SM (MISCELLANEOUS) ×3 IMPLANT
DRSG COVADERM PLUS 2X2 (GAUZE/BANDAGES/DRESSINGS) ×6 IMPLANT
DRSG OPSITE POSTOP 3X4 (GAUZE/BANDAGES/DRESSINGS) ×2 IMPLANT
GLOVE BIO SURGEON STRL SZ8 (GLOVE) ×3 IMPLANT
GLOVE ORTHO TXT STRL SZ7.5 (GLOVE) ×3 IMPLANT
GOWN STRL REUS W/TWL 2XL LVL3 (GOWN DISPOSABLE) ×3 IMPLANT
GOWN STRL REUS W/TWL LRG LVL3 (GOWN DISPOSABLE) ×6 IMPLANT
LIQUID BAND (GAUZE/BANDAGES/DRESSINGS) ×3 IMPLANT
NDL EPID 17G 5 ECHO TUOHY (NEEDLE) IMPLANT
NEEDLE EPID 17G 5 ECHO TUOHY (NEEDLE) IMPLANT
NEEDLE INSUFFLATION 120MM (ENDOMECHANICALS) ×3 IMPLANT
NS IRRIG 1000ML POUR BTL (IV SOLUTION) ×3 IMPLANT
PACK LAPAROSCOPY BASIN (CUSTOM PROCEDURE TRAY) ×3 IMPLANT
PAD TRENDELENBURG OR TABLE (MISCELLANEOUS) ×3 IMPLANT
POUCH SPECIMEN RETRIEVAL 10MM (ENDOMECHANICALS) IMPLANT
PROTECTOR NERVE ULNAR (MISCELLANEOUS) ×3 IMPLANT
SET IRRIG TUBING LAPAROSCOPIC (IRRIGATION / IRRIGATOR) IMPLANT
SHEARS HARMONIC ACE PLUS 36CM (ENDOMECHANICALS) IMPLANT
SLEEVE XCEL OPT CAN 5 100 (ENDOMECHANICALS) ×2 IMPLANT
SOLUTION ELECTROLUBE (MISCELLANEOUS) IMPLANT
SUT VICRYL 0 UR6 27IN ABS (SUTURE) IMPLANT
SUT VICRYL 4-0 PS2 18IN ABS (SUTURE) ×3 IMPLANT
TOWEL OR 17X24 6PK STRL BLUE (TOWEL DISPOSABLE) ×6 IMPLANT
TRAY FOLEY CATH 14FR (SET/KITS/TRAYS/PACK) IMPLANT
TROCAR XCEL NON-BLD 11X100MML (ENDOMECHANICALS) IMPLANT
TROCAR XCEL NON-BLD 5MMX100MML (ENDOMECHANICALS) ×3 IMPLANT
WARMER LAPAROSCOPE (MISCELLANEOUS) ×3 IMPLANT
WATER STERILE IRR 1000ML POUR (IV SOLUTION) ×3 IMPLANT

## 2014-08-17 NOTE — Anesthesia Postprocedure Evaluation (Signed)
  Anesthesia Post Note  Patient: Erin Dickerson  Procedure(s) Performed: Procedure(s) (LRB): LAPAROSCOPIC BILATERAL SALPINGECTOMY (Bilateral)  Anesthesia type: GA  Patient location: PACU  Post pain: Pain level controlled  Post assessment: Post-op Vital signs reviewed  Last Vitals:  Filed Vitals:   08/17/14 0930  BP: 129/64  Pulse: 83  Temp:   Resp: 20    Post vital signs: Reviewed  Level of consciousness: sedated  Complications: No apparent anesthesia complications

## 2014-08-17 NOTE — Anesthesia Preprocedure Evaluation (Addendum)
Anesthesia Evaluation  Patient identified by MRN, date of birth, ID band Patient awake    Reviewed: Allergy & Precautions, H&P , NPO status , Patient's Chart, lab work & pertinent test results  Airway Mallampati: III  TM Distance: >3 FB Neck ROM: Full    Dental no notable dental hx. (+) Teeth Intact   Pulmonary neg pulmonary ROS,  breath sounds clear to auscultation  Pulmonary exam normal       Cardiovascular negative cardio ROS  Rhythm:Regular Rate:Normal     Neuro/Psych negative neurological ROS  negative psych ROS   GI/Hepatic negative GI ROS, Neg liver ROS,   Endo/Other  Obesity  Renal/GU negative Renal ROS  negative genitourinary   Musculoskeletal negative musculoskeletal ROS (+)   Abdominal (+) + obese,   Peds  Hematology negative hematology ROS (+)   Anesthesia Other Findings   Reproductive/Obstetrics Desires Sterilization                            Anesthesia Physical Anesthesia Plan  ASA: II  Anesthesia Plan: General   Post-op Pain Management:    Induction: Intravenous  Airway Management Planned: Oral ETT  Additional Equipment:   Intra-op Plan:   Post-operative Plan: Extubation in OR  Informed Consent: I have reviewed the patients History and Physical, chart, labs and discussed the procedure including the risks, benefits and alternatives for the proposed anesthesia with the patient or authorized representative who has indicated his/her understanding and acceptance.   Dental advisory given  Plan Discussed with: CRNA, Anesthesiologist and Surgeon  Anesthesia Plan Comments:         Anesthesia Quick Evaluation

## 2014-08-17 NOTE — Op Note (Signed)
Preoperative diagnosis: Desires surgical sterility Postoperative diagnosis: Same, abdominal adhesions Procedure: Laparoscopic bilateral salpingectomy, adhesiolysis Surgeon: Cheri Fowler M.D. Anesthesia: Gen. Endotracheal tube Findings: She had a normal pelvis with normal uterus tubes and ovaries, omental adhesions to the anterior abdominal wall Specimens: Bilateral fallopian tubes Estimated blood loss: Minimal Complications: None  Procedure in detail  The patient was taken to the operating room and placed in the dorsosupine position. General anesthesia was induced. Her legs were placed in mobile stirrups and her left arm was tucked to her side. Abdomen perineum and vagina were then prepped and draped in the usual sterile fashion, a Foley catheter was inserted, a Hulka tenaculum was applied to the cervix for uterine manipulation. Infraumbilical skin was then infiltrated with quarter percent Marcaine and a 1 cm vertical incision was made. The veress needle was inserted into the peritoneal cavity and placement confirmed by the water drop test and an opening pressure of 3 mm of mercury. CO2 was insufflated to a pressure of 12 mm of mercury and the veress needle was removed. A 10/11 disposable trocar was then introduced with direct visualization with the laparoscope. A 5 mm port was then placed on the left side also under direct visualization, and a second 5 mm trocar low in the midline. Inspection revealed the above-mentioned findings.  The omental adhesions were taken down with bipolar cautery and monopolar scissors.  The distal end of each tube was grasped and elevated. Using bipolar cautery I was able to free the distal end of each tube from the ovary and fulgurate across the mesosalpinx and a proximal portion of the fallopian tube. Scissors were then used to remove the fallopian tube. A small amount of bleeding from each side was controlled with bipolar cautery. This is done bilaterally without  difficulty. Both segments of tube were removed through the 5 mm midline trocar. The 5 mm ports were removed under direct visualization. All gas was allowed to deflate from the abdomen and the umbilical trocar was removed. One figure-of-eight suture of 0 Vicryl was placed in the umbilical incision. Skin incisions were then closed with interrupted subcuticular sutures of 4-0 Vicryl followed by Dermabond. A figure 8 through and through suture was used on the incision on the left due to bleeding.  The Hulka tenaculum and Foley were removed. The patient was taken down from stirrups. She was awakened in the operating room and taken to the recovery room in stable condition after tolerating the procedure well. Counts were correct and she had PAS hose on throughout the procedure.

## 2014-08-17 NOTE — Transfer of Care (Signed)
Immediate Anesthesia Transfer of Care Note  Patient: Erin Dickerson  Procedure(s) Performed: Procedure(s) with comments: LAPAROSCOPIC BILATERAL SALPINGECTOMY (Bilateral) - Physician needs 1hr OR time  Patient Location: PACU  Anesthesia Type:General  Level of Consciousness: awake, sedated and patient cooperative  Airway & Oxygen Therapy: Patient Spontanous Breathing and Patient connected to nasal cannula oxygen  Post-op Assessment: Report given to PACU RN and Post -op Vital signs reviewed and stable  Post vital signs: Reviewed and stable  Complications: No apparent anesthesia complications

## 2014-08-17 NOTE — Interval H&P Note (Signed)
History and Physical Interval Note:  08/17/2014 8:10 AM  Erin Dickerson  has presented today for surgery, with the diagnosis of Sterilization, [phone]  The various methods of treatment have been discussed with the patient and family. After consideration of risks, benefits and other options for treatment, the patient has consented to  Procedure(s) with comments: LAPAROSCOPIC BILATERAL SALPINGECTOMY (Bilateral) - Physician needs 1hr OR time as a surgical intervention .  The patient's history has been reviewed, patient examined, no change in status, stable for surgery.  I have reviewed the patient's chart and labs.  Questions were answered to the patient's satisfaction.     [name] D

## 2014-08-17 NOTE — Anesthesia Procedure Notes (Addendum)
Date/Time: 08/17/2014 8:37 AM Performed by: Stacie Glaze C    Procedure Name: Intubation Date/Time: 08/17/2014 8:31 AM Performed by: Tobin Chad Pre-anesthesia Checklist: Patient identified, Timeout performed, Emergency Drugs available, Suction available and Patient being monitored Patient Re-evaluated:Patient Re-evaluated prior to inductionOxygen Delivery Method: Circle system utilized Preoxygenation: Pre-oxygenation with 100% oxygen Intubation Type: IV induction Ventilation: Mask ventilation without difficulty Laryngoscope Size: Mac and 3 Grade View: Grade II Tube type: Oral Number of attempts: 1 Placement Confirmation: ETT inserted through vocal cords under direct vision,  positive ETCO2 and breath sounds checked- equal and bilateral Secured at: 21 cm Tube secured with: Tape Dental Injury: Teeth and Oropharynx as per pre-operative assessment     Performed by: Stacie Glaze C Dental Injury: Injury to lip

## 2014-08-17 NOTE — Discharge Instructions (Signed)

## 2014-08-18 ENCOUNTER — Encounter (HOSPITAL_COMMUNITY): Payer: Self-pay | Admitting: Obstetrics and Gynecology

## 2019-10-19 ENCOUNTER — Other Ambulatory Visit: Payer: Self-pay

## 2019-10-19 ENCOUNTER — Encounter (HOSPITAL_COMMUNITY): Payer: Self-pay | Admitting: Emergency Medicine

## 2019-10-19 ENCOUNTER — Emergency Department (HOSPITAL_COMMUNITY)
Admission: EM | Admit: 2019-10-19 | Discharge: 2019-10-19 | Disposition: A | Discharge status: Home / Self Care | Payer: BLUE CROSS/BLUE SHIELD | Attending: Emergency Medicine | Admitting: Emergency Medicine

## 2019-10-19 ENCOUNTER — Emergency Department (HOSPITAL_COMMUNITY): Payer: BLUE CROSS/BLUE SHIELD

## 2019-10-19 DIAGNOSIS — R079 Chest pain, unspecified: Secondary | ICD-10-CM | POA: Insufficient documentation

## 2019-10-19 DIAGNOSIS — M7918 Myalgia, other site: Secondary | ICD-10-CM | POA: Diagnosis not present

## 2019-10-19 LAB — BASIC METABOLIC PANEL
Anion gap: 11 (ref 5–15)
BUN: 9 mg/dL (ref 6–20)
CO2: 20 mmol/L — ABNORMAL LOW (ref 22–32)
Calcium: 9.2 mg/dL (ref 8.9–10.3)
Chloride: 108 mmol/L (ref 98–111)
Creatinine, Ser: 0.71 mg/dL (ref 0.44–1.00)
GFR calc Af Amer: >60 mL/min (ref >60)
GFR calc non Af Amer: >60 mL/min (ref >60)
Glucose, Bld: 95 mg/dL (ref 70–99)
Potassium: 3.7 mmol/L (ref 3.5–5.1)
Sodium: 139 mmol/L (ref 135–145)

## 2019-10-19 LAB — CBC
HCT: 41.4 % (ref 36.0–46.0)
Hemoglobin: 13.5 g/dL (ref 12.0–15.0)
MCH: 30.4 pg (ref 26.0–34.0)
MCHC: 32.6 g/dL (ref 30.0–36.0)
MCV: 93.2 fL (ref 80.0–100.0)
Platelets: 244 10*3/uL (ref 150–400)
RBC: 4.44 MIL/uL (ref 3.87–5.11)
RDW: 12.7 % (ref 11.5–15.5)
WBC: 7.6 10*3/uL (ref 4.0–10.5)
nRBC: 0 % (ref 0.0–0.2)

## 2019-10-19 LAB — I-STAT BETA HCG BLOOD, ED (MC, WL, AP ONLY): I-stat hCG, quantitative: <5 m[IU]/mL (ref <5)

## 2019-10-19 LAB — TROPONIN I (HIGH SENSITIVITY)
Troponin I (High Sensitivity): <2 ng/L (ref <18)
Troponin I (High Sensitivity): <2 ng/L (ref <18)

## 2019-10-19 MED ORDER — LIDOCAINE 5 % EX PTCH
1.0000 | MEDICATED_PATCH | CUTANEOUS | Status: DC
  Administered 2019-10-19: 1 via TRANSDERMAL
  Filled 2019-10-19: qty 1

## 2019-10-19 NOTE — ED Triage Notes (Signed)
Patient c/o left sided chest pain onset of Saturday. States originally pain went into her back but stopped. describes pain as constant and throbbing. Denies any shortness of breath, cough or fevers.

## 2019-10-19 NOTE — ED Provider Notes (Signed)
[facility] EMERGENCY DEPARTMENT Provider Note   CSN: [id number] Arrival date & time: 10/19/19  1127     History Chief Complaint  Patient presents with  . Chest Pain    Erin Dickerson is a 44 y.o. female.  44yo female with past medical history of melenotic nevus presents with compalint of left side chest pain. Pain started with pain in the left back on Saturday also noted to have pain in the upper left breast area.  Patient states her husband massaged the left side of her\left trapezius area and that pain has resolved however she continues to have a discomfort in her left upper breast. Pain is intermittent, lasts for several minutes and then resolves for about 5 minutes at a time, dull in nature, no relief with taking Excedrin, nothing makes pain better, no pain with exertion, pain is not worse with movement. Denies associated SHOB, nausea, vomiting, no changes in bowel or bladder habits, diaphoresis, abdominal pain. Does not feel similar to prior GERD pain, denies trauma/falls.  Also reports left-sided headache.  No other complaints or concerns. Denies history of hypertension, hyperlipidemia, diabetes, patient is a non smoker. No significant family history.         Past Medical History:  Diagnosis Date  . Melanocytic nevus   . SVD (spontaneous vaginal delivery)    x 3    Patient Active Problem List   Diagnosis Date Noted  . Contraception management 08/17/2014  . Cholestasis of pregnancy 04/14/2014  . SVD (spontaneous vaginal delivery) 04/14/2014  . Missed abortion at [redacted] weeks GA 04/02/2012    Past Surgical History:  Procedure Laterality Date  . CESAREAN SECTION  2004  . DILATION AND EVACUATION  04/03/2012   Procedure: DILATATION AND EVACUATION (D&E) 2ND TRIMESTER;  Surgeon: Mora Bellman, MD;  Location: [facility];  Service: Gynecology;  Laterality: N/A;  with ultrasound/chromosome studies  . LAPAROSCOPIC BILATERAL SALPINGECTOMY Bilateral  08/17/2014   Procedure: LAPAROSCOPIC BILATERAL SALPINGECTOMY;  Surgeon: Cheri Fowler, MD;  Location: [facility];  Service: Gynecology;  Laterality: Bilateral;  Physician needs 1hr OR time     OB History    Gravida  5   Para  4   Term  4   Preterm      AB  1   Living  4     SAB  1   TAB      Ectopic      Multiple      Live Births  4           No family history on file.  Social History   Tobacco Use  . Smoking status: Never Smoker  . Smokeless tobacco: Never Used  Substance Use Topics  . Alcohol use: No  . Drug use: No    Home Medications Prior to Admission medications   Medication Sig Start Date End Date Taking? Authorizing Provider  HYDROcodone-acetaminophen (NORCO) 5-325 MG per tablet Take 1-2 tablets by mouth every 6 (six) hours as needed for moderate pain. 08/17/14   Meisinger, Sherren Mocha, MD  ibuprofen (ADVIL,MOTRIN) 600 MG tablet Take 1 tablet (600 mg total) by mouth every 6 (six) hours. 04/15/14   Paula Compton, MD  Prenatal Vit-Fe Fumarate-FA (PRENATAL MULTIVITAMIN) TABS Take 1 tablet by mouth daily. 09/17/12   Moreno-Coll, Adlih, MD    Allergies    Patient has no known allergies.  Review of Systems   Review of Systems  Constitutional: Negative for chills, diaphoresis and fever.  Respiratory: Negative for shortness of  breath.   Cardiovascular: Positive for chest pain.  Gastrointestinal: Negative for abdominal pain, nausea and vomiting.  Musculoskeletal: Positive for back pain, myalgias and neck pain. Negative for gait problem and joint swelling.  Skin: Negative for color change, rash and wound.  Allergic/Immunologic: Negative for immunocompromised state.  Neurological: Positive for headaches. Negative for dizziness, speech difficulty and weakness.  Hematological: Negative for adenopathy.  Psychiatric/Behavioral: Negative for confusion.  All other systems reviewed and are negative.   Physical Exam Updated Vital Signs BP 110/68   Pulse 63    Temp 98.1 F (36.7 C) (Oral)   Resp 15   SpO2 99%   Physical Exam Vitals and nursing note reviewed.  Constitutional:      General: She is not in acute distress.    Appearance: She is well-developed. She is not diaphoretic.  HENT:     Head: Normocephalic and atraumatic.  Cardiovascular:     Rate and Rhythm: Normal rate and regular rhythm.     Heart sounds: Normal heart sounds.  Pulmonary:     Effort: Pulmonary effort is normal.     Breath sounds: Normal breath sounds. No decreased breath sounds.  Chest:     Chest wall: No tenderness.  Abdominal:     Palpations: Abdomen is soft.     Tenderness: There is no abdominal tenderness.  Musculoskeletal:       Arms:     Cervical back: Normal range of motion.     Right lower leg: No edema.     Left lower leg: No edema.     Comments: Pain in left trapezius/left posterior neck reproducible with palpation.  Skin:    General: Skin is warm and dry.     Findings: No rash.  Neurological:     Mental Status: She is alert and oriented to person, place, and time.  Psychiatric:        Behavior: Behavior normal.     ED Results / Procedures / Treatments   Labs (all labs ordered are listed, but only abnormal results are displayed) Labs Reviewed  BASIC METABOLIC PANEL - Abnormal; Notable for the following components:      Result Value   CO2 20 (*)    All other components within normal limits  CBC  I-STAT BETA HCG BLOOD, ED ([facility], [facility], [facility] ONLY)  TROPONIN I (HIGH SENSITIVITY)  TROPONIN I (HIGH SENSITIVITY)    EKG None  Radiology DG Chest 2 View  Result Date: 10/19/2019 CLINICAL DATA:  Chest pain EXAM: CHEST - 2 VIEW COMPARISON:  None. FINDINGS: Lungs are clear. Heart size and pulmonary vascularity are normal. No adenopathy. No pneumothorax. No bone lesions. IMPRESSION: No abnormality noted. Electronically Signed   By: Lowella Grip III M.D.   On: 10/19/2019 11:53    Procedures Procedures (including critical care time)   Medications Ordered in ED Medications  lidocaine (LIDODERM) 5 % 1 patch (1 patch Transdermal Patch Applied 10/19/19 1247)    ED Course  I have reviewed the triage vital signs and the nursing notes.  Pertinent labs & imaging results that were available during my care of the patient were reviewed by me and considered in my medical decision making (see chart for details).  Clinical Course as of Oct 19 1539  Tue Oct 18, 4834  [id number] 44 year old female with no significant past medical history presents with complaint of pain in her left trapezius area and left upper breast.  Denies shortness of breath or pain with exertion.  On exam  has tenderness palpation left trapezius, no chest wall tenderness.  EKG without acute ischemic changes, chest x-ray unremarkable, CBC, BMP without significant findings, hCG negative.  Troponin less than 2, awaiting second troponin before discharge.  Patient was given a Lidoderm patch for her trapezius tenderness, suspect musculoskeletal pain today.   [LM]    Clinical Course User Index [LM] Roque Lias   MDM Rules/Calculators/A&P                      Final Clinical Impression(s) / ED Diagnoses Final diagnoses:  Nonspecific chest pain  Musculoskeletal pain    Rx / DC Orders ED Discharge Orders    None       Tacy Learn, PA-C 10/19/19 1541    Lennice Sites, DO 10/19/19 1634

## 2019-10-19 NOTE — Discharge Instructions (Addendum)
You may take Motrin and Tylenol as needed as directed for your pain.  Continue with massage of the back muscles, warm compresses for 30 minutes at a time and gentle stretching. Follow-up with your primary care provider.  Return to the emergency room for new or worsening symptoms.

## 2019-10-19 NOTE — ED Notes (Signed)
Got patient into a gown on the monitor patient is resting with call bell in [facility] 

## 2019-12-09 ENCOUNTER — Ambulatory Visit: Payer: BLUE CROSS/BLUE SHIELD | Attending: Family

## 2019-12-09 DIAGNOSIS — Z23 Encounter for immunization: Secondary | ICD-10-CM

## 2019-12-09 NOTE — Progress Notes (Signed)
   Covid-19 Vaccination Clinic  Name:  Erin Dickerson    MRN: [id number] DOB: [birth year]  12/09/2019  [name] was observed post Covid-19 immunization for 15 minutes without incident. She was provided with Vaccine Information Sheet and instruction to access the V-Safe system.   [name] was instructed to call 911 with any severe reactions post vaccine: Marland Kitchen Difficulty breathing  . Swelling of face and throat  . A fast heartbeat  . A bad rash all over body  . Dizziness and weakness   Immunizations Administered    Name Date Dose VIS Date Route   Moderna COVID-19 Vaccine 12/09/2019 11:34 AM 0.5 mL 08/03/2019 Intramuscular   Manufacturer: Moderna   Lot: [id number]   [id number]

## 2020-01-11 ENCOUNTER — Ambulatory Visit: Payer: BLUE CROSS/BLUE SHIELD | Attending: Family

## 2020-01-11 DIAGNOSIS — Z23 Encounter for immunization: Secondary | ICD-10-CM

## 2020-01-11 NOTE — Progress Notes (Signed)
   Covid-19 Vaccination Clinic  Name:  Oriah Hollenbach    MRN: [id number] DOB: 09/08/1975  01/11/2020  Ms. Janis was observed post Covid-19 immunization for 15 minutes without incident. She was provided with Vaccine Information Sheet and instruction to access the V-Safe system.   Ms. Insinga was instructed to call 911 with any severe reactions post vaccine: Marland Kitchen Difficulty breathing  . Swelling of face and throat  . A fast heartbeat  . A bad rash all over body  . Dizziness and weakness   Immunizations Administered    Name Date Dose VIS Date Route   Moderna COVID-19 Vaccine 01/11/2020 10:47 AM 0.5 mL 08/2019 Intramuscular   Manufacturer: Moderna   Lot: [id number]   [id number]

## 2020-09-21 ENCOUNTER — Ambulatory Visit: Payer: BLUE CROSS/BLUE SHIELD

## 2020-09-22 ENCOUNTER — Ambulatory Visit: Payer: BLUE CROSS/BLUE SHIELD
